# Patient Record
Sex: Female | Born: 1950 | Race: White | Hispanic: No | Smoking: Current every day smoker
Health system: Southern US, Community
[De-identification: ages and names within clinical notes are randomized; demographics above are authoritative.]

## PROBLEM LIST (undated history)

## (undated) DIAGNOSIS — M810 Age-related osteoporosis without current pathological fracture: Secondary | ICD-10-CM

## (undated) DIAGNOSIS — K7460 Unspecified cirrhosis of liver: Secondary | ICD-10-CM

## (undated) DIAGNOSIS — I739 Peripheral vascular disease, unspecified: Secondary | ICD-10-CM

## (undated) DIAGNOSIS — I70223 Atherosclerosis of native arteries of extremities with rest pain, bilateral legs: Secondary | ICD-10-CM

## (undated) HISTORY — PX: OTHER SURGICAL HISTORY: SHX169

---

## 2011-11-28 ENCOUNTER — Emergency Department (HOSPITAL_COMMUNITY)
Admission: EM | Admit: 2011-11-28 | Discharge: 2011-11-28 | Disposition: A | Discharge status: Home / Self Care | Payer: Self-pay | Attending: Emergency Medicine | Admitting: Emergency Medicine

## 2011-11-28 ENCOUNTER — Encounter (HOSPITAL_COMMUNITY): Payer: Self-pay | Admitting: Emergency Medicine

## 2011-11-28 DIAGNOSIS — R600 Localized edema: Secondary | ICD-10-CM

## 2011-11-28 DIAGNOSIS — R1900 Intra-abdominal and pelvic swelling, mass and lump, unspecified site: Secondary | ICD-10-CM | POA: Insufficient documentation

## 2011-11-28 DIAGNOSIS — R141 Gas pain: Secondary | ICD-10-CM | POA: Insufficient documentation

## 2011-11-28 DIAGNOSIS — R142 Eructation: Secondary | ICD-10-CM | POA: Insufficient documentation

## 2011-11-28 DIAGNOSIS — K7290 Hepatic failure, unspecified without coma: Secondary | ICD-10-CM

## 2011-11-28 DIAGNOSIS — R140 Abdominal distension (gaseous): Secondary | ICD-10-CM

## 2011-11-28 DIAGNOSIS — R17 Unspecified jaundice: Secondary | ICD-10-CM | POA: Insufficient documentation

## 2011-11-28 DIAGNOSIS — R609 Edema, unspecified: Secondary | ICD-10-CM | POA: Insufficient documentation

## 2011-11-28 DIAGNOSIS — F17200 Nicotine dependence, unspecified, uncomplicated: Secondary | ICD-10-CM | POA: Insufficient documentation

## 2011-11-28 DIAGNOSIS — R34 Anuria and oliguria: Secondary | ICD-10-CM | POA: Insufficient documentation

## 2011-11-28 DIAGNOSIS — M7989 Other specified soft tissue disorders: Secondary | ICD-10-CM | POA: Insufficient documentation

## 2011-11-28 DIAGNOSIS — K769 Liver disease, unspecified: Secondary | ICD-10-CM | POA: Insufficient documentation

## 2011-11-28 HISTORY — DX: Peripheral vascular disease, unspecified: I73.9

## 2011-11-28 LAB — DIFFERENTIAL
Basophils Absolute: 0.1 10*3/uL (ref 0.0–0.1)
Basophils Relative: 1 % (ref 0–1)
Monocytes Absolute: 0.5 10*3/uL (ref 0.1–1.0)
Neutro Abs: 8.7 10*3/uL — ABNORMAL HIGH (ref 1.7–7.7)
Neutrophils Relative %: 72 % (ref 43–77)

## 2011-11-28 LAB — CBC
MCHC: 36 g/dL (ref 30.0–36.0)
RDW: 14.4 % (ref 11.5–15.5)

## 2011-11-28 LAB — COMPREHENSIVE METABOLIC PANEL
ALT: 15 U/L (ref 0–35)
Calcium: 8 mg/dL — ABNORMAL LOW (ref 8.4–10.5)
GFR calc Af Amer: >90 mL/min (ref >90)
Glucose, Bld: 116 mg/dL — ABNORMAL HIGH (ref 70–99)
Sodium: 131 mEq/L — ABNORMAL LOW (ref 135–145)
Total Protein: 6.2 g/dL (ref 6.0–8.3)

## 2011-11-28 MED ORDER — FUROSEMIDE 20 MG PO TABS: 20.0000 mg | ORAL_TABLET | Freq: Two times a day (BID) | ORAL | Status: AC

## 2011-11-28 MED ORDER — POTASSIUM CHLORIDE CRYS ER 20 MEQ PO TBCR
40.0000 meq | EXTENDED_RELEASE_TABLET | Freq: Once | ORAL | Status: AC
  Administered 2011-11-28: 40 meq via ORAL
  Filled 2011-11-28: qty 2

## 2011-11-28 MED ORDER — SODIUM CHLORIDE 0.9 % IV SOLN: INTRAVENOUS | Status: DC

## 2011-11-28 NOTE — Discharge Instructions (Signed)
Your blood test show mild inflammation of your liver. Resume taking Lasix to reduce the swelling.  Followup with your Dr. for reevaluation.  Return for worse or uncontrolled symptoms

## 2011-11-28 NOTE — ED Notes (Signed)
Pt's family reports recent dx of liver disease d/t etoh.  Pt presents with ascites, and bila leg swelling.  Pt is jaundiced as well.  Pt assisted to the BR with family, pt refused to be taken to the BR with a wheelchair.  Pt ambulated to the BR with steady gait and one assist.  Went in to start IV but pt wanted to use BR first.

## 2011-11-28 NOTE — ED Notes (Signed)
Started 1  Month ago-- abd swelling , urine dark, jaundice, saw dr in April, was told had liver damage, since seeing dr. Roe Coombs has gotten bigger and more firm. Has pitting edema bilat ankles. Has gained 5 pounds in a couple weeks

## 2011-11-28 NOTE — ED Provider Notes (Signed)
History     CSN: [id number]  Arrival date & time 11/28/11  1616   First MD Initiated Contact with Patient 11/28/11 1807      Chief Complaint  Patient presents with  . abd swelling   . Jaundice  . oliguria     (Consider location/radiation/quality/duration/timing/severity/associated sxs/prior treatment) The history is provided by the patient and a relative.   the patient is a 61 year old, female, who drinks alcohol daily until the past month.  She presents to the emergency department with abdominal swelling.  Causing discomfort, which she does not describe his pain.  She also complains of lower extremity swelling.  She had been seen by her primary care physician at [facility] and prescribed a diuretic.  She is no longer taking the diuretic.  She denies pain.  She denies any other symptoms.  Past Medical History  Diagnosis Date  . PAD (peripheral artery disease)     stents 2010    Past Surgical History  Procedure Date  . Vulvular cancer     History reviewed. No pertinent family history.  History  Substance Use Topics  . Smoking status: Current Everyday Smoker -- 1.0 packs/day  . Smokeless tobacco: Not on file  . Alcohol Use: Yes     nothing for at least a month, but 2-6 beers a day    OB History    Grav Para Term Preterm Abortions TAB SAB Ect Mult Living                  Review of Systems  Constitutional: Negative for fever and chills.  HENT: Negative for neck pain.   Respiratory: Negative for cough and shortness of breath.   Cardiovascular: Positive for leg swelling. Negative for chest pain.  Gastrointestinal: Positive for abdominal distention. Negative for nausea, vomiting, abdominal pain and anal bleeding.  Skin: Negative for color change and rash.  Neurological: Negative for headaches.  Psychiatric/Behavioral: Negative for confusion.  All other systems reviewed and are negative.    Allergies  Review of patient's allergies indicates no known allergies.  Home  Medications   Current Outpatient Rx  Name Route Sig Dispense Refill  . ASPIRIN EC 81 MG PO TBEC Oral Take 81 mg by mouth daily.    Marland Kitchen VITAMIN D 1000 UNITS PO TABS Oral Take 1,000 Units by mouth daily.    . IBUPROFEN 400 MG PO TABS Oral Take 400 mg by mouth every 6 (six) hours as needed. For pain relief    . IBUPROFEN 200 MG PO TABS Oral Take 200 mg by mouth every 6 (six) hours as needed. For pain relief    . ADULT MULTIVITAMIN W/MINERALS CH Oral Take 1 tablet by mouth daily.    Marland Kitchen OMEPRAZOLE 20 MG PO CPDR Oral Take 20 mg by mouth daily.      BP 99/68  Pulse 98  Temp(Src) 97.5 F (36.4 C) (Oral)  Resp 22  Wt 118 lb (53.524 kg)  SpO2 100%  Physical Exam  Vitals reviewed. Constitutional: She is oriented to person, place, and time. No distress.       Sallow looking female in no distress  HENT:  Head: Normocephalic and atraumatic.  Eyes: Conjunctivae are normal. No scleral icterus.  Neck: Normal range of motion. Neck supple.  Cardiovascular: Normal rate and regular rhythm.   No murmur heard. Pulmonary/Chest: Effort normal. No respiratory distress. She has no wheezes. She has no rales.  Abdominal: Soft. She exhibits distension. There is tenderness. There is no rebound  and no guarding.       Mild right upper quadrant tenderness Liver edge approximately 4 cm below.  The right costal margin  Musculoskeletal: Normal range of motion. She exhibits edema.       2+ bilateral pitting edema in shins  Neurological: She is alert and oriented to person, place, and time. No cranial nerve deficit.       Negative asterixis  Skin: Skin is dry. No erythema.  Psychiatric: She has a normal mood and affect. Thought content normal.    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  No signs of hepatic encephalopathy or severe systemic illness   Labs Reviewed  CBC  DIFFERENTIAL  COMPREHENSIVE METABOLIC PANEL  URINALYSIS, ROUTINE W REFLEX MICROSCOPIC     No results found.   No diagnosis found.    MDM  Abdominal distention from chronic alcohol use        Cheri Guppy, MD 11/28/11 2236

## 2011-12-01 ENCOUNTER — Other Ambulatory Visit: Payer: Self-pay | Admitting: Gastroenterology

## 2011-12-01 DIAGNOSIS — K7460 Unspecified cirrhosis of liver: Secondary | ICD-10-CM

## 2011-12-02 ENCOUNTER — Ambulatory Visit
Admission: RE | Admit: 2011-12-02 | Discharge: 2011-12-02 | Disposition: A | Discharge status: Home / Self Care | Payer: Self-pay | Source: Ambulatory Visit | Attending: Gastroenterology | Admitting: Gastroenterology

## 2011-12-02 DIAGNOSIS — K7460 Unspecified cirrhosis of liver: Secondary | ICD-10-CM

## 2011-12-05 ENCOUNTER — Other Ambulatory Visit (HOSPITAL_COMMUNITY): Payer: Self-pay | Admitting: Gastroenterology

## 2011-12-05 DIAGNOSIS — R140 Abdominal distension (gaseous): Secondary | ICD-10-CM

## 2011-12-05 DIAGNOSIS — K7030 Alcoholic cirrhosis of liver without ascites: Secondary | ICD-10-CM

## 2011-12-08 ENCOUNTER — Ambulatory Visit (HOSPITAL_COMMUNITY)
Admission: RE | Admit: 2011-12-08 | Discharge: 2011-12-08 | Disposition: A | Discharge status: Home / Self Care | Payer: Self-pay | Source: Ambulatory Visit | Attending: Gastroenterology | Admitting: Gastroenterology

## 2011-12-08 DIAGNOSIS — R140 Abdominal distension (gaseous): Secondary | ICD-10-CM

## 2011-12-08 DIAGNOSIS — K7030 Alcoholic cirrhosis of liver without ascites: Secondary | ICD-10-CM

## 2011-12-08 DIAGNOSIS — R188 Other ascites: Secondary | ICD-10-CM | POA: Insufficient documentation

## 2011-12-08 DIAGNOSIS — K709 Alcoholic liver disease, unspecified: Secondary | ICD-10-CM | POA: Insufficient documentation

## 2011-12-08 LAB — COMPREHENSIVE METABOLIC PANEL
ALT: 15 U/L (ref 0–35)
AST: 88 U/L — ABNORMAL HIGH (ref 0–37)
Alkaline Phosphatase: 198 U/L — ABNORMAL HIGH (ref 39–117)
CO2: 27 mEq/L (ref 19–32)
Chloride: 89 mEq/L — ABNORMAL LOW (ref 96–112)
Creatinine, Ser: 0.53 mg/dL (ref 0.50–1.10)
GFR calc non Af Amer: >90 mL/min (ref >90)
Sodium: 129 mEq/L — ABNORMAL LOW (ref 135–145)
Total Bilirubin: 3.5 mg/dL — ABNORMAL HIGH (ref 0.3–1.2)

## 2011-12-08 LAB — ALBUMIN, FLUID (OTHER): Albumin, Fluid: 0.4 g/dL

## 2011-12-08 LAB — BODY FLUID CELL COUNT WITH DIFFERENTIAL

## 2011-12-08 NOTE — Procedures (Signed)
Successful US guided paracentesis from RLQ.  Yielded 2.5L of clear yellow fluid.  No immediate complications.  Pt tolerated well.   Specimen was sent for labs.  Brayton El PA-C 12/08/2011 12:23 PM

## 2011-12-09 LAB — PATHOLOGIST SMEAR REVIEW: Tech Review: REACTIVE

## 2011-12-12 LAB — BODY FLUID CULTURE: Culture: NO GROWTH

## 2011-12-27 ENCOUNTER — Encounter (HOSPITAL_COMMUNITY): Payer: Self-pay | Admitting: *Deleted

## 2011-12-27 ENCOUNTER — Emergency Department (HOSPITAL_COMMUNITY)
Admission: EM | Admit: 2011-12-27 | Discharge: 2011-12-27 | Disposition: A | Discharge status: Home / Self Care | Payer: Self-pay | Attending: Emergency Medicine | Admitting: Emergency Medicine

## 2011-12-27 DIAGNOSIS — K5900 Constipation, unspecified: Secondary | ICD-10-CM | POA: Insufficient documentation

## 2011-12-27 DIAGNOSIS — K644 Residual hemorrhoidal skin tags: Secondary | ICD-10-CM | POA: Insufficient documentation

## 2011-12-27 DIAGNOSIS — K649 Unspecified hemorrhoids: Secondary | ICD-10-CM

## 2011-12-27 HISTORY — DX: Age-related osteoporosis without current pathological fracture: M81.0

## 2011-12-27 HISTORY — DX: Unspecified cirrhosis of liver: K74.60

## 2011-12-27 LAB — COMPREHENSIVE METABOLIC PANEL
ALT: 19 U/L (ref 0–35)
AST: 75 U/L — ABNORMAL HIGH (ref 0–37)
Albumin: 2.6 g/dL — ABNORMAL LOW (ref 3.5–5.2)
Alkaline Phosphatase: 175 U/L — ABNORMAL HIGH (ref 39–117)
BUN: 10 mg/dL (ref 6–23)
CO2: 23 mEq/L (ref 19–32)
Calcium: 8.8 mg/dL (ref 8.4–10.5)
Chloride: 92 mEq/L — ABNORMAL LOW (ref 96–112)
Creatinine, Ser: 0.41 mg/dL — ABNORMAL LOW (ref 0.50–1.10)
GFR calc Af Amer: >90 mL/min (ref >90)
GFR calc non Af Amer: >90 mL/min (ref >90)
Glucose, Bld: 118 mg/dL — ABNORMAL HIGH (ref 70–99)
Potassium: 3.8 mEq/L (ref 3.5–5.1)
Sodium: 129 mEq/L — ABNORMAL LOW (ref 135–145)
Total Bilirubin: 2.3 mg/dL — ABNORMAL HIGH (ref 0.3–1.2)
Total Protein: 7.1 g/dL (ref 6.0–8.3)

## 2011-12-27 LAB — CBC
HCT: 34.5 % — ABNORMAL LOW (ref 36.0–46.0)
Hemoglobin: 12.4 g/dL (ref 12.0–15.0)
MCH: 40.7 pg — ABNORMAL HIGH (ref 26.0–34.0)
MCHC: 35.9 g/dL (ref 30.0–36.0)
MCV: 113.1 fL — ABNORMAL HIGH (ref 78.0–100.0)
Platelets: 269 10*3/uL (ref 150–400)
RBC: 3.05 MIL/uL — ABNORMAL LOW (ref 3.87–5.11)
RDW: 14.9 % (ref 11.5–15.5)
WBC: 14.1 10*3/uL — ABNORMAL HIGH (ref 4.0–10.5)

## 2011-12-27 LAB — DIFFERENTIAL
Basophils Absolute: 0.1 10*3/uL (ref 0.0–0.1)
Basophils Relative: 1 % (ref 0–1)
Eosinophils Absolute: 0 10*3/uL (ref 0.0–0.7)
Eosinophils Relative: 0 % (ref 0–5)
Lymphocytes Relative: 23 % (ref 12–46)
Lymphs Abs: 3.2 10*3/uL (ref 0.7–4.0)
Monocytes Absolute: 0.6 10*3/uL (ref 0.1–1.0)
Monocytes Relative: 4 % (ref 3–12)
Neutro Abs: 10.2 10*3/uL — ABNORMAL HIGH (ref 1.7–7.7)
Neutrophils Relative %: 72 % (ref 43–77)

## 2011-12-27 MED ORDER — HYDROCORTISONE 2.5 % RE CREA: TOPICAL_CREAM | RECTAL | Status: AC

## 2011-12-27 MED ORDER — PEG 3350-KCL-NABCB-NACL-NASULF 236 G PO SOLR: 4.0000 L | Freq: Once | ORAL | Status: AC

## 2011-12-27 MED ORDER — FLEET ENEMA 7-19 GM/118ML RE ENEM
1.0000 | ENEMA | Freq: Once | RECTAL | Status: AC
  Administered 2011-12-27: 1 via RECTAL
  Filled 2011-12-27: qty 1

## 2011-12-27 MED ORDER — LIDOCAINE HCL 2 % EX GEL: CUTANEOUS | Status: AC | PRN

## 2011-12-27 NOTE — ED Notes (Signed)
Pt states she is having rectum pain. Pt states she is able to pass gas but no bm.

## 2011-12-27 NOTE — ED Notes (Signed)
Per ems: pt is coming from home. Pt was seen at her pcp for impacted bowel. Pt was given laxative and told if she does not have a bm to come to the ed.

## 2011-12-27 NOTE — ED Notes (Signed)
[id number]> Expected date:<BR> Expected time: 2:47 PM<BR> Means of arrival:<BR> Comments:<BR> M61 - 61yoF Bowel impaction *TRIAGE-CAPABLE*

## 2011-12-27 NOTE — ED Notes (Signed)
Pt positioned in L Sims position..instructed to hold Fleets enema as long as possible for best results

## 2011-12-27 NOTE — Discharge Instructions (Signed)
You may try a another enema tomorrow. Drink as much of the go-lytely bottle as you can tolerate. Treat your hemorrhoids with lidocaine and anusol.

## 2011-12-27 NOTE — ED Provider Notes (Signed)
History     CSN: [id number]  Arrival date & time 12/27/11  1501   None     No chief complaint on file.   (Consider location/radiation/quality/duration/timing/severity/associated sxs/prior treatment) Patient is a 61 y.o. female presenting with constipation.  Constipation  The current episode started more than 2 weeks ago. The onset was gradual. The pain is severe. The stool is described as liquid. There was no prior successful therapy. Associated symptoms include abdominal pain and nausea. Pertinent negatives include no anorexia, no fever, no vomiting and no chest pain.  61 y/o female INAD with PMH significant for liver insufficiency c/o constipation x1 month with overflow diarrhea. Pt saw performed a mineral oil enema yesterday with little relief and was instructed to follow at the ED by her PCP. Pt reports LLQ abdominal pain at 10/10 no radiating. reduced appetite and nausea. Denies fever/Chills. Pt is passing gas and leaking liquid stool actively. Last colonoscopy was in 2006 and, as per patient shows no abnormality. Denies h/o abdominal surgeries or FH of colorectal cancer.   Past Medical History  Diagnosis Date  . PAD (peripheral artery disease)     stents 2010  . Osteoporosis   . Cirrhosis of liver     Past Surgical History  Procedure Date  . Vulvular cancer     No family history on file.  History  Substance Use Topics  . Smoking status: Current Everyday Smoker -- 1.0 packs/day  . Smokeless tobacco: Not on file  . Alcohol Use: Yes     nothing for at least a month, but 2-6 beers a day    OB History    Grav Para Term Preterm Abortions TAB SAB Ect Mult Living                  Review of Systems  Constitutional: Negative.  Negative for fever.  Respiratory: Negative for shortness of breath.   Cardiovascular: Negative for chest pain.  Gastrointestinal: Positive for nausea, abdominal pain and constipation. Negative for vomiting, abdominal distention and anorexia.   Genitourinary: Negative for difficulty urinating.    Allergies  Review of patient's allergies indicates no known allergies.  Home Medications   Current Outpatient Rx  Name Route Sig Dispense Refill  . ASPIRIN EC 81 MG PO TBEC Oral Take 81 mg by mouth daily.    Marland Kitchen VITAMIN D 1000 UNITS PO TABS Oral Take 1,000 Units by mouth daily.    . FUROSEMIDE 20 MG PO TABS Oral Take 1 tablet (20 mg total) by mouth 2 (two) times daily. 30 tablet 0  . ADULT MULTIVITAMIN W/MINERALS CH Oral Take 1 tablet by mouth daily.    Marland Kitchen OMEPRAZOLE 20 MG PO CPDR Oral Take 20 mg by mouth daily.      BP 102/59  Pulse 115  Temp(Src) 97.8 F (36.6 C) (Oral)  Resp 20  SpO2 100%  Physical Exam  Constitutional: She is oriented to person, place, and time. She appears well-developed. No distress.       Extremely thin  HENT:  Head: Normocephalic and atraumatic.  Eyes: Pupils are equal, round, and reactive to light.  Neck: Normal range of motion.  Cardiovascular: Normal rate.   Pulmonary/Chest: Effort normal and breath sounds normal.  Abdominal: Soft. Bowel sounds are normal. She exhibits no distension.       Mild LLQ abdominal pain, external hemorrhoids and hard stool in vault  Musculoskeletal: Normal range of motion.  Neurological: She is alert and oriented to person, place, and time.  Skin: Skin is warm and dry. She is not diaphoretic.  Psychiatric: She has a normal mood and affect.    ED Course  Procedures (including critical care time)  Labs Reviewed  CBC - Abnormal; Notable for the following:    WBC 14.1 (*)    RBC 3.05 (*)    HCT 34.5 (*)    MCV 113.1 (*)    MCH 40.7 (*)    All other components within normal limits  DIFFERENTIAL - Abnormal; Notable for the following:    Neutro Abs 10.2 (*)    All other components within normal limits  COMPREHENSIVE METABOLIC PANEL - Abnormal; Notable for the following:    Sodium 129 (*)    Chloride 92 (*)    Glucose, Bld 118 (*)    Creatinine, Ser 0.41 (*)     Albumin 2.6 (*)    AST 75 (*) SLIGHT HEMOLYSIS   Alkaline Phosphatase 175 (*)    Total Bilirubin 2.3 (*)    All other components within normal limits  URINALYSIS, ROUTINE W REFLEX MICROSCOPIC   No results found.   No diagnosis found. Fecal impaction   Pt would not tolerate manual disimpaction secondary to pain  Pt given fleet enema with no no bowel movement   MDM  Pt with fecal impaction and VSS, will d./c with treatment for hemorrhoids and go lightly and instruct to follow with PCP        Wynetta Emery, PA-C 12/27/11 2028

## 2011-12-27 NOTE — ED Notes (Signed)
Pt waiting for lab results 

## 2011-12-30 NOTE — ED Provider Notes (Signed)
Medical screening examination/treatment/procedure(s) were performed by non-physician practitioner and as supervising physician I was immediately available for consultation/collaboration.  Conna Terada, MD 12/30/11 0133 

## 2012-03-14 ENCOUNTER — Ambulatory Visit (HOSPITAL_COMMUNITY): Payer: Self-pay | Admitting: Anesthesiology

## 2012-03-14 ENCOUNTER — Ambulatory Visit (HOSPITAL_COMMUNITY)
Admission: RE | Admit: 2012-03-14 | Discharge: 2012-03-14 | Disposition: A | Discharge status: Home / Self Care | Payer: Self-pay | Source: Ambulatory Visit | Attending: Gastroenterology | Admitting: Gastroenterology

## 2012-03-14 ENCOUNTER — Encounter (HOSPITAL_COMMUNITY): Payer: Self-pay | Admitting: Anesthesiology

## 2012-03-14 ENCOUNTER — Encounter (HOSPITAL_COMMUNITY)
Admission: RE | Disposition: A | Discharge status: Home / Self Care | Payer: Self-pay | Source: Ambulatory Visit | Attending: Gastroenterology

## 2012-03-14 DIAGNOSIS — K319 Disease of stomach and duodenum, unspecified: Secondary | ICD-10-CM | POA: Insufficient documentation

## 2012-03-14 DIAGNOSIS — K7030 Alcoholic cirrhosis of liver without ascites: Secondary | ICD-10-CM | POA: Insufficient documentation

## 2012-03-14 DIAGNOSIS — F1020 Alcohol dependence, uncomplicated: Secondary | ICD-10-CM | POA: Insufficient documentation

## 2012-03-14 SURGERY — ESOPHAGOGASTRODUODENOSCOPY (EGD) WITH PROPOFOL
Anesthesia: Monitor Anesthesia Care

## 2012-03-14 MED ORDER — LACTATED RINGERS IV SOLN
INTRAVENOUS | Status: DC
  Administered 2012-03-14: 11:00:00 via INTRAVENOUS

## 2012-03-14 MED ORDER — LACTATED RINGERS IV SOLN
INTRAVENOUS | Status: DC | PRN
  Administered 2012-03-14: 10:00:00 via INTRAVENOUS

## 2012-03-14 MED ORDER — MIDAZOLAM HCL 5 MG/5ML IJ SOLN
INTRAMUSCULAR | Status: DC | PRN
  Administered 2012-03-14: 2 mg via INTRAVENOUS

## 2012-03-14 MED ORDER — FENTANYL CITRATE 0.05 MG/ML IJ SOLN
INTRAMUSCULAR | Status: DC | PRN
  Administered 2012-03-14 (×2): 50 ug via INTRAVENOUS

## 2012-03-14 MED ORDER — LIDOCAINE HCL (CARDIAC) 20 MG/ML IV SOLN
INTRAVENOUS | Status: DC | PRN
  Administered 2012-03-14: 70 mg via INTRAVENOUS

## 2012-03-14 MED ORDER — SODIUM CHLORIDE 0.9 % IV SOLN: INTRAVENOUS | Status: DC

## 2012-03-14 MED ORDER — PROPOFOL INFUSION 10 MG/ML OPTIME
INTRAVENOUS | Status: DC | PRN
  Administered 2012-03-14: 75 ug/kg/min via INTRAVENOUS

## 2012-03-14 SURGICAL SUPPLY — 14 items

## 2012-03-14 NOTE — Anesthesia Preprocedure Evaluation (Addendum)
Anesthesia Evaluation  Patient identified by MRN, date of birth, ID band Patient awake    Reviewed: Allergy & Precautions, H&P , NPO status , Patient's Chart, lab work & pertinent test results  Airway Mallampati: II TM Distance: >3 FB Neck ROM: full    Dental  (+) Dental Advisory Given, Edentulous Upper and Edentulous Lower   Pulmonary neg pulmonary ROS, COPDCurrent Smoker,  breath sounds clear to auscultation  Pulmonary exam normal       Cardiovascular Exercise Tolerance: Good Rhythm:regular Rate:Normal  Peripheral vascular disease with stents 2010   Neuro/Psych negative neurological ROS  negative psych ROS   GI/Hepatic negative GI ROS, (+) Cirrhosis -  ascites     ,   Endo/Other  negative endocrine ROS  Renal/GU negative Renal ROS  negative genitourinary   Musculoskeletal   Abdominal   Peds  Hematology negative hematology ROS (+)   Anesthesia Other Findings   Reproductive/Obstetrics negative OB ROS Vulvar cancer                          Anesthesia Physical Anesthesia Plan  ASA: III  Anesthesia Plan: MAC   Post-op Pain Management:    Induction:   Airway Management Planned:   Additional Equipment:   Intra-op Plan:   Post-operative Plan:   Informed Consent: I have reviewed the patients History and Physical, chart, labs and discussed the procedure including the risks, benefits and alternatives for the proposed anesthesia with the patient or authorized representative who has indicated his/her understanding and acceptance.   Dental Advisory Given  Plan Discussed with: CRNA and Surgeon  Anesthesia Plan Comments:         Anesthesia Quick Evaluation

## 2012-03-14 NOTE — Transfer of Care (Signed)
Immediate Anesthesia Transfer of Care Note  Patient: Wendy Dixon  Procedure(s) Performed: Procedure(s) (LRB): ESOPHAGOGASTRODUODENOSCOPY (EGD) WITH PROPOFOL (N/A)  Patient Location: PACU  Anesthesia Type: MAC  Level of Consciousness: sedated, patient cooperative and responds to stimulaton  Airway & Oxygen Therapy: Patient Spontanous Breathing and Patient connected to face mask oxgen  Post-op Assessment: Report given to PACU RN and Post -op Vital signs reviewed and stable  Post vital signs: Reviewed and stable  Complications: No apparent anesthesia complications

## 2012-03-14 NOTE — Op Note (Signed)
[facility] [address] [place], [address]   ENDOSCOPY PROCEDURE REPORT  PATIENT: Wendy, Dixon  MR#: [id number] BIRTHDATE: 1951/01/11 , 61  yrs. old GENDER: Female ENDOSCOPIST: Willis Modena, MD REFERRED BY:  Ancil Boozer, MD PROCEDURE DATE:  03/14/2012 PROCEDURE:  Upper Endoscopy ASA CLASS:     ASA III INDICATIONS:  cirrhosis, variceal screening MEDICATIONS: MAC Anesthesia TOPICAL ANESTHETIC: Cetacaine x 2  DESCRIPTION OF PROCEDURE: After the risks benefits and alternatives of the procedure were thoroughly explained, informed consent was obtained.  The Pentax [id number] peds [id number] endoscope was introduced through the mouth and advanced to the second portion of the duodenum     . Without limitations.  The instrument was slowly withdrawn as the mucosa was fully examined.     Findings: Normal esophagus; specifically, no evidence of esophageal varices.  Mild portal gastropathy, otherwise normal stomach; no evidence of gastric varices upon retroflexion into the cardia. Otherwise normal endoscopy to the second portion of the duodenum.  ENDOSCOPIC IMPRESSION:     As above.  No evidence of esophageal or gastric varices.  RECOMMENDATIONS:     1.  Watch for potential complications of procedure. 2.  No need for beta blockade therapy. 3.  Consider repeat EGD 2-3 years. 4.  Follow-up with [facility] in 6-8 weeks.  eSigned:  Willis Modena, MD 03/14/2012 11:20 AM   CC:

## 2012-03-14 NOTE — H&P (Signed)
Patient interval history reviewed.  Patient examined again.  There has been no change from documented H/P dated 02/24/12 (scanned into chart from our office) except as documented above.  Assessment:  1.  Cirrhosis, alcohol-mediated. 2.  Screening for varices (no clinical bleeding).  Plan:  1.  Upper endoscopy. 2.  Risks (bleeding, infection, bowel perforation that could require surgery, sedation-related changes in cardiopulmonary systems), benefits (identification and possible treatment of source of symptoms, exclusion of certain causes of symptoms), and alternatives (watchful waiting, radiographic imaging studies, empiric medical treatment) of upper endoscopy (EGD) were explained to patient/son in detail and patient wishes to proceed.

## 2012-03-14 NOTE — Anesthesia Postprocedure Evaluation (Signed)
  Anesthesia Post-op Note  Patient: Wendy Dixon  Procedure(s) Performed: Procedure(s) (LRB): ESOPHAGOGASTRODUODENOSCOPY (EGD) WITH PROPOFOL (N/A)  Patient Location: PACU  Anesthesia Type: MAC  Level of Consciousness: awake and alert   Airway and Oxygen Therapy: Patient Spontanous Breathing  Post-op Pain: mild  Post-op Assessment: Post-op Vital signs reviewed, Patient's Cardiovascular Status Stable, Respiratory Function Stable, Patent Airway and No signs of Nausea or vomiting  Post-op Vital Signs: stable  Complications: No apparent anesthesia complications

## 2012-03-14 NOTE — Preoperative (Signed)
Beta Blockers   Reason not to administer Beta Blockers:Not Applicable 

## 2012-05-25 ENCOUNTER — Other Ambulatory Visit: Payer: Self-pay | Admitting: Gastroenterology

## 2012-05-25 DIAGNOSIS — K7460 Unspecified cirrhosis of liver: Secondary | ICD-10-CM

## 2012-05-30 ENCOUNTER — Ambulatory Visit
Admission: RE | Admit: 2012-05-30 | Discharge: 2012-05-30 | Disposition: A | Discharge status: Home / Self Care | Payer: No Typology Code available for payment source | Source: Ambulatory Visit | Attending: Gastroenterology | Admitting: Gastroenterology

## 2012-05-30 DIAGNOSIS — K7460 Unspecified cirrhosis of liver: Secondary | ICD-10-CM

## 2012-10-09 IMAGING — US US ABDOMEN COMPLETE
1 series · 13 of 25 positions shown · non-contrast
Comparison: None.

***ADDENDUM*** CREATED: 12/05/2011 [DATE]

The original report was by Dr. Dr. Marlena Krull.  The following
addendum is by Dr. Khadra Gervacio:
 Dr. [REDACTED] called to find out whether there is an
adequate amount of ascites for paracentesis at [DATE] p.m. on
12/05/2011.  At the time of exam there does appear to be an
adequate amount of ascites for successful paracentesis.
CLINICAL DATA: Cirrhosis.  Ascites.
COMPLETE ABDOMINAL ULTRASOUND

[Series 1: us abdomen complete · 0.26mm/px · 13 of 84 slices shown]
[im 1/84]
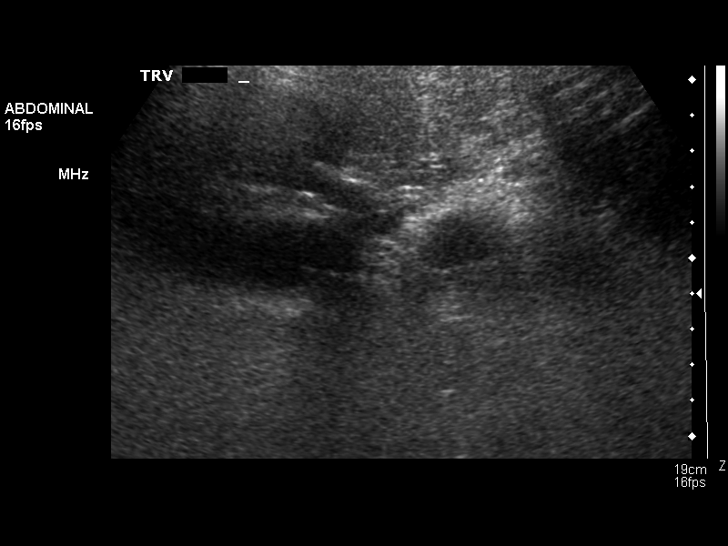
[im 7/84]
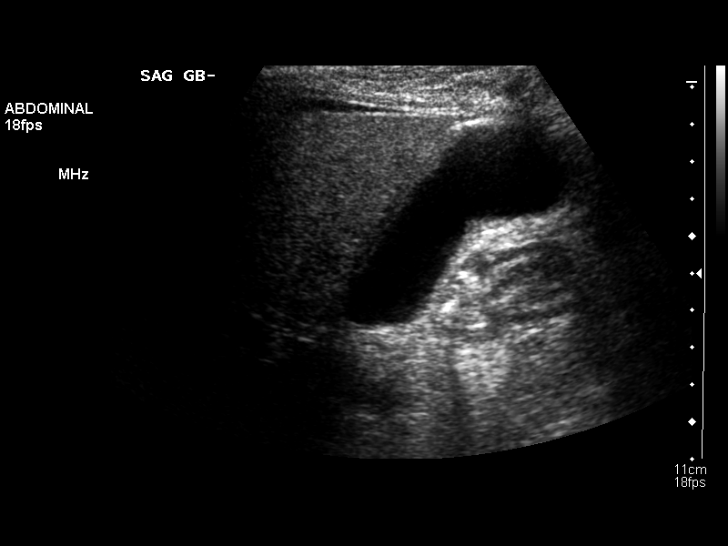
[im 14/84]
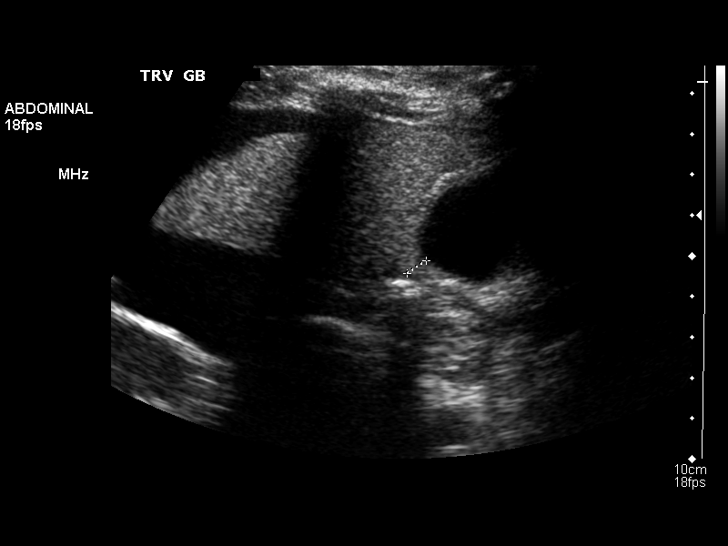
[im 21/84]
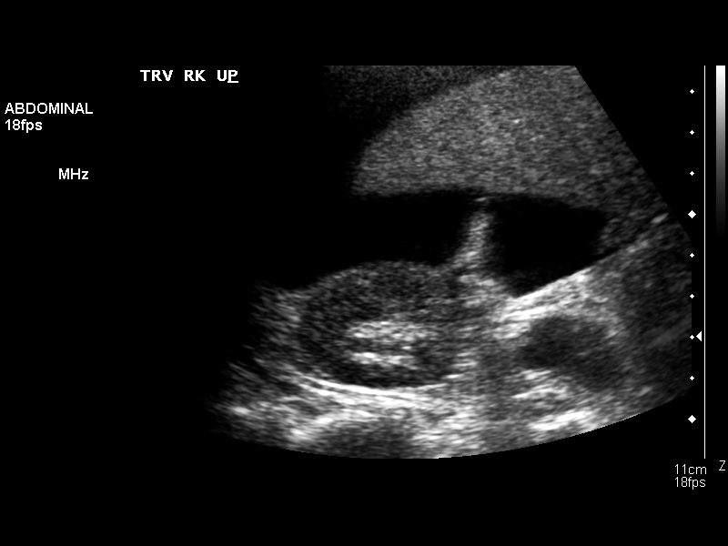
[im 28/84]
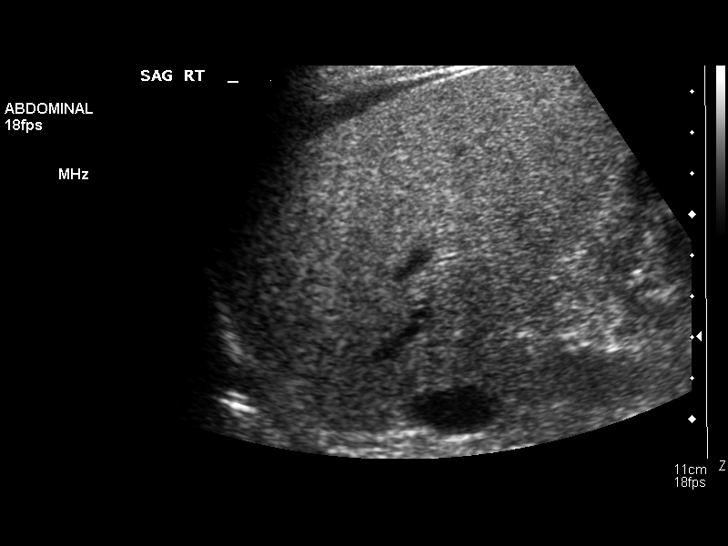
[im 35/84]
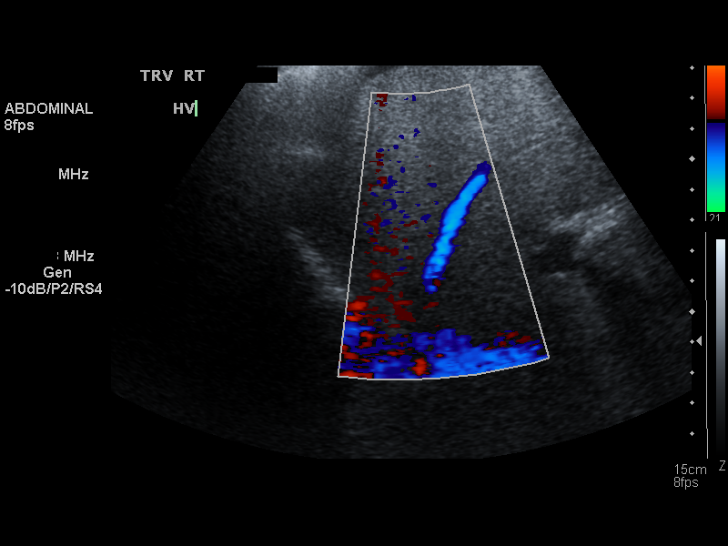
[im 42/84]
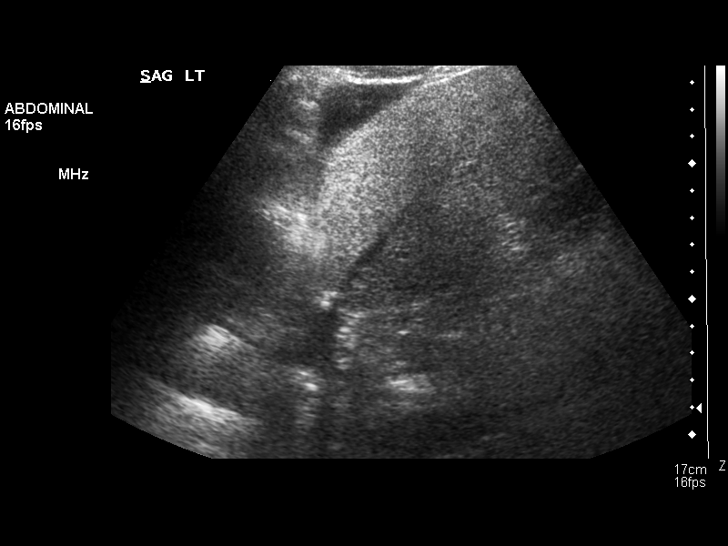
[im 49/84]
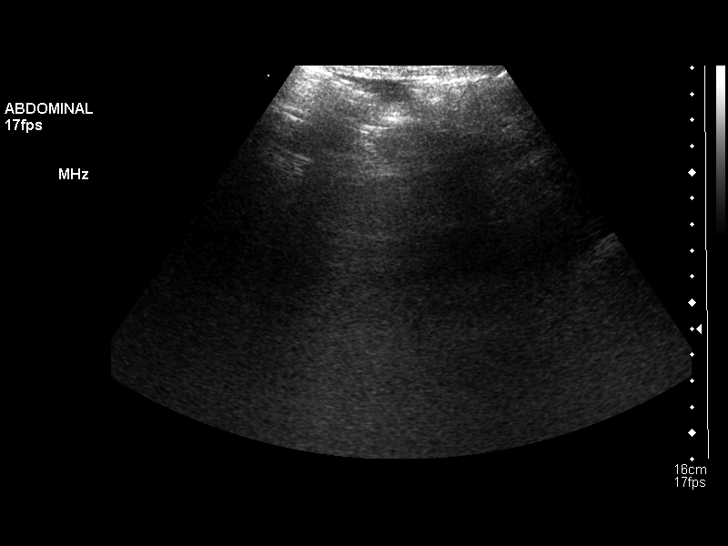
[im 56/84]
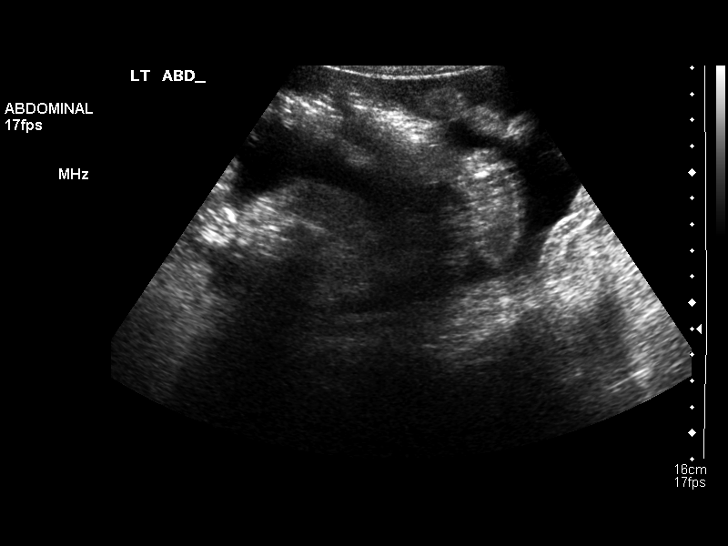
[im 63/84]
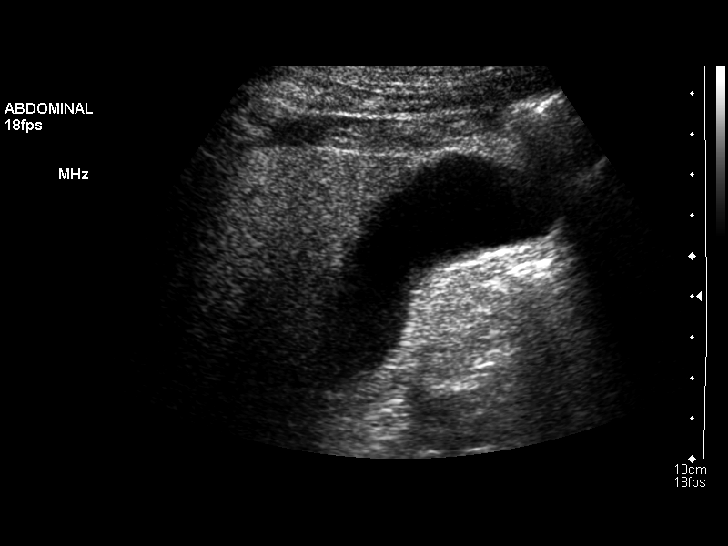
[im 70/84]
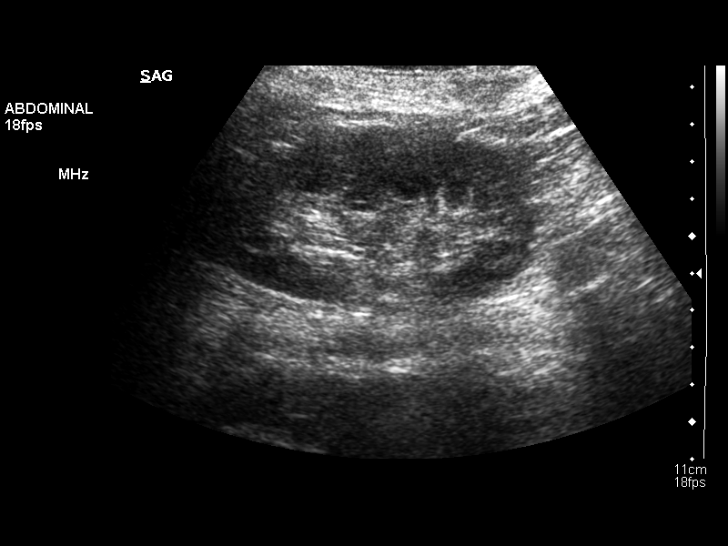
[im 77/84]
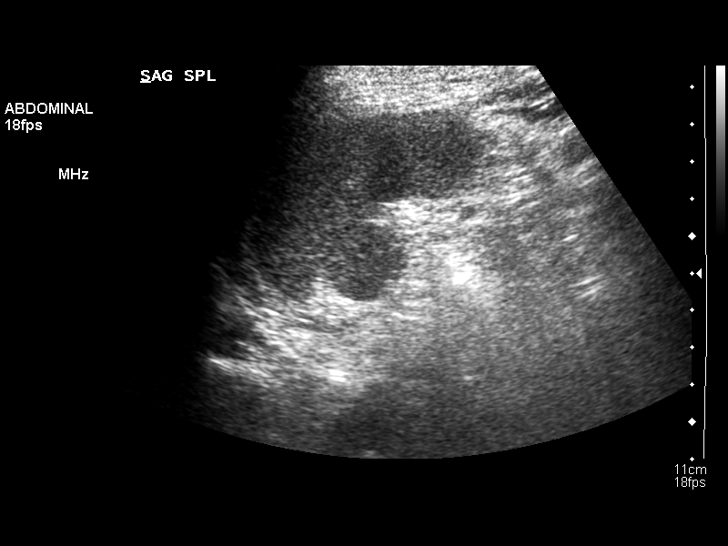
[im 84/84]
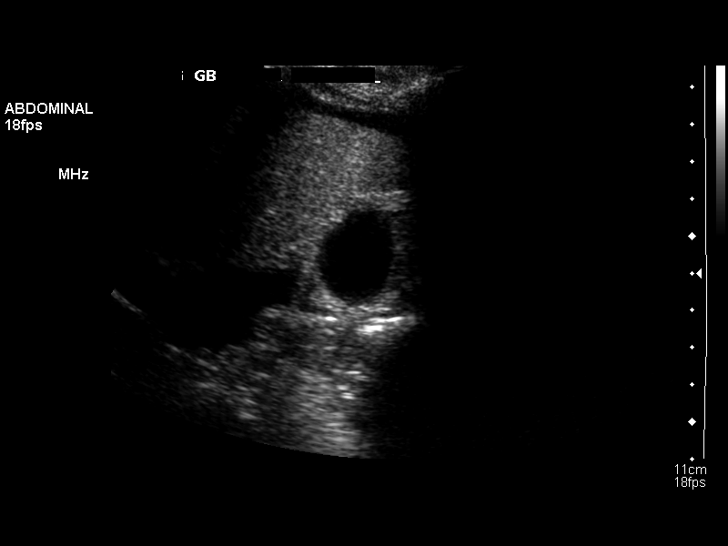

[13 of 25 positions shown; findings below may reference images not displayed]

FINDINGS: Gallbladder:  No stones, sludge, pericholecystic fluid.  Small
amount of ascites is present.  Gallbladder wall thickening is
present which is nonspecific in the setting of cirrhosis and
ascites have.

Common bile duct:  2 mm, normal.

Liver:  The liver is echogenic, with heterogeneous echotexture.
There is no micronodular cirrhosis along the surface of the liver
identified.  No focal mass lesion is identified. Normal blood flow
in the portal vein.

IVC:  Nonvisualization due to overlying bowel gas.

Pancreas:  Suboptimal visualization due to overlying bowel gas.

Spleen:  Splenic echotexture is within normal limits.  7.6 cm long
axis.

Right Kidney:  10.6 cm. Normal echotexture.  Normal central sinus
echo complex.  No calculi or hydronephrosis.

Left Kidney:  10.0 cm. Normal echotexture.  Normal central sinus
echo complex.  No calculi or hydronephrosis.

Abdominal aorta:  Nonvisualization due to overlying bowel gas.
IMPRESSION: 1.  Ascites and heterogeneous echogenic liver, likely representing
hepatic cirrhosis.  No micronodular cirrhosis is identified along
the surface of the liver.  No hepatic masses identified.
2.  Nonspecific gallbladder wall thickening, likely associated with
cirrhosis.
3.  Normal size of the spleen.

## 2012-12-26 ENCOUNTER — Other Ambulatory Visit: Payer: Self-pay | Admitting: Gastroenterology

## 2012-12-26 DIAGNOSIS — K7460 Unspecified cirrhosis of liver: Secondary | ICD-10-CM

## 2012-12-31 ENCOUNTER — Ambulatory Visit
Admission: RE | Admit: 2012-12-31 | Discharge: 2012-12-31 | Disposition: A | Discharge status: Home / Self Care | Payer: No Typology Code available for payment source | Source: Ambulatory Visit | Attending: Gastroenterology | Admitting: Gastroenterology

## 2012-12-31 DIAGNOSIS — K7460 Unspecified cirrhosis of liver: Secondary | ICD-10-CM

## 2013-04-07 IMAGING — US US ABDOMEN LIMITED
1 series · 14 of 25 positions shown · non-contrast
Comparison: Abdominal ultrasound - 12/02/2011; ultrasound guided
paracentesis - 12/08/2011

CLINICAL DATA: Cirrhosis, history smoking

LIMITED ABDOMINAL ULTRASOUND - RIGHT UPPER QUADRANT

[Series 1: us abdomen limited · 0.28mm/px · 14 of 48 slices shown]
[im 1/48]
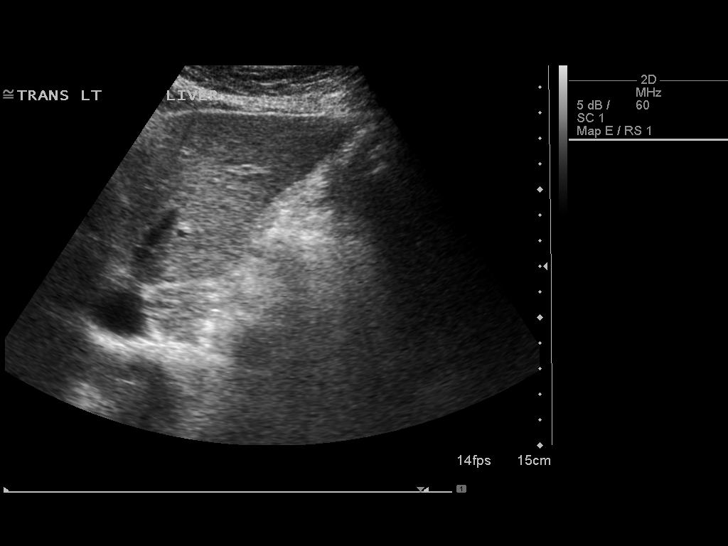
[im 4/48]
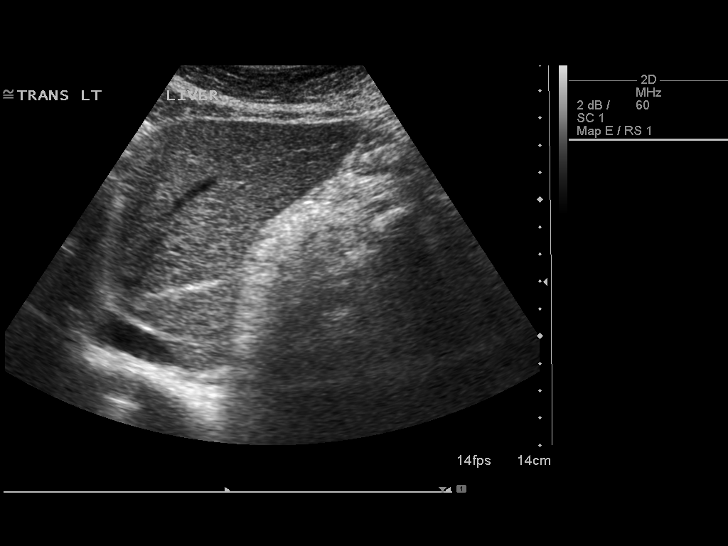
[im 8/48]
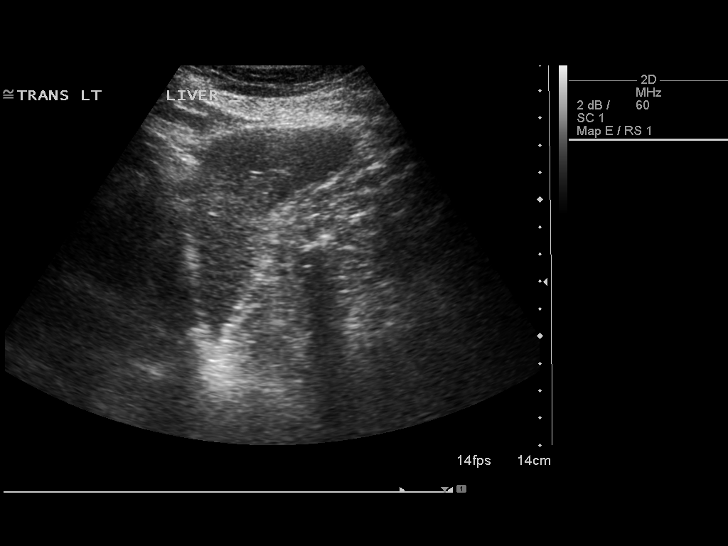
[im 12/48]
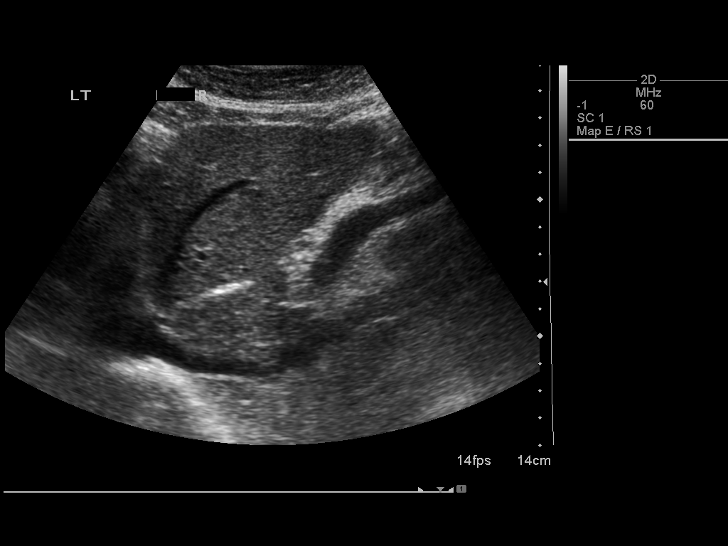
[im 16/48]
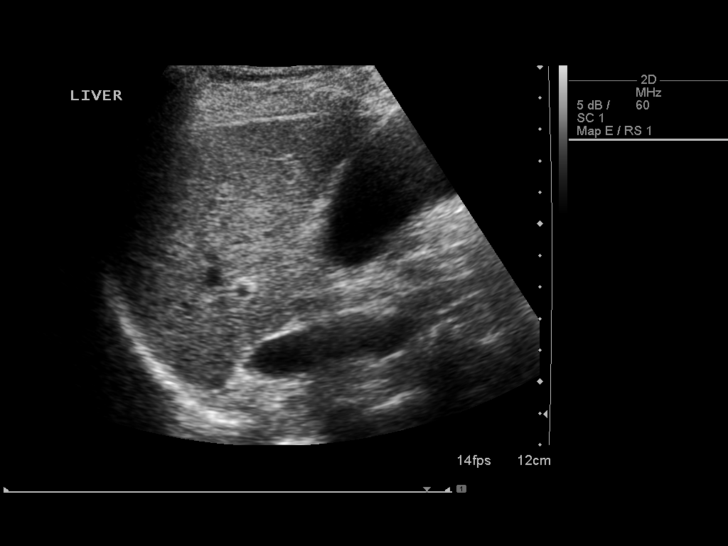
[im 18/48]
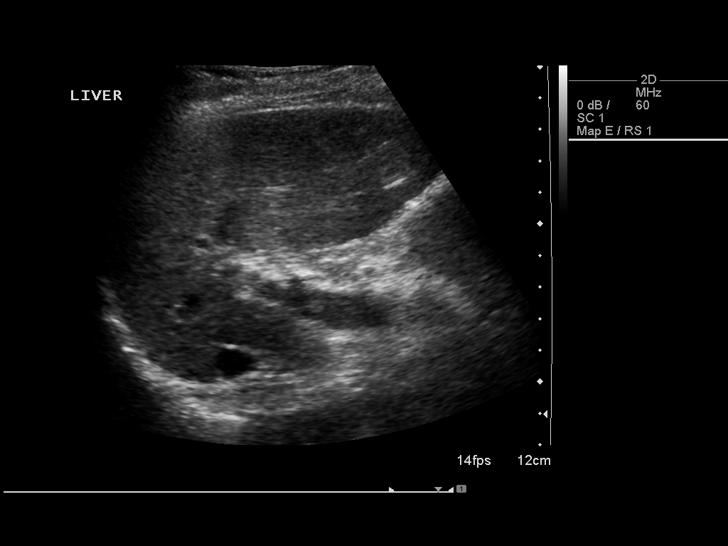
[im 22/48]
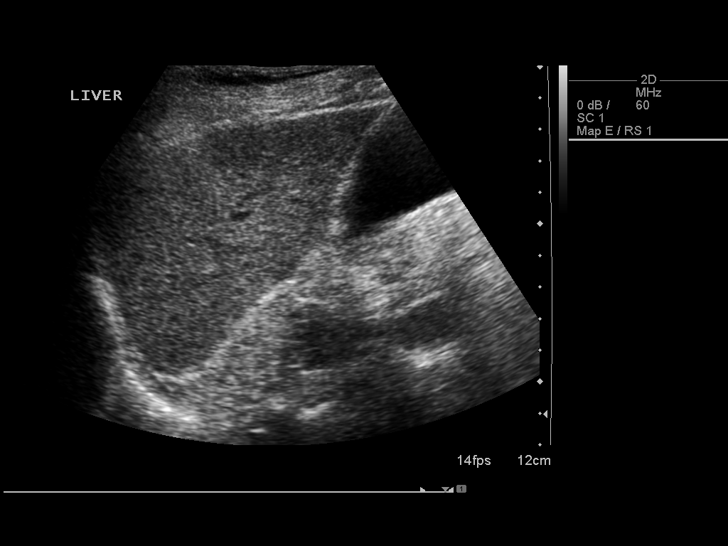
[im 26/48]
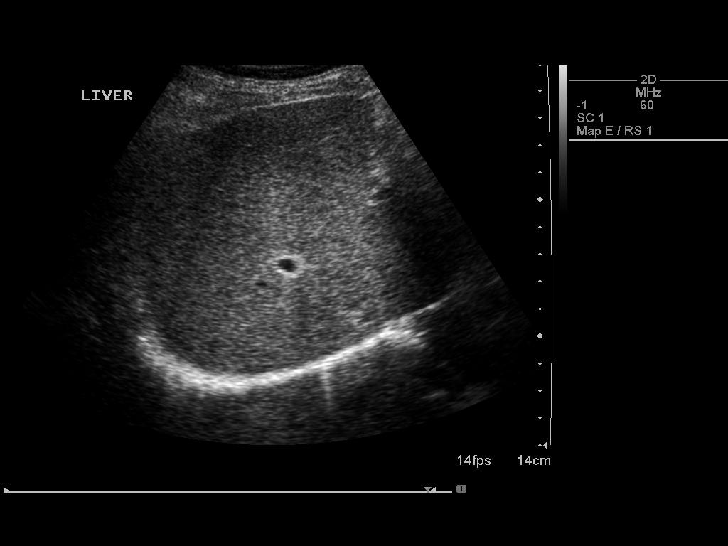
[im 30/48]
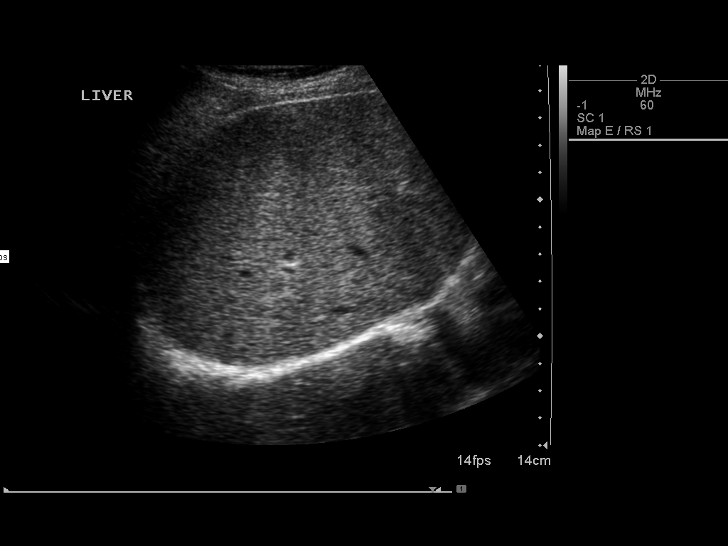
[im 32/48]
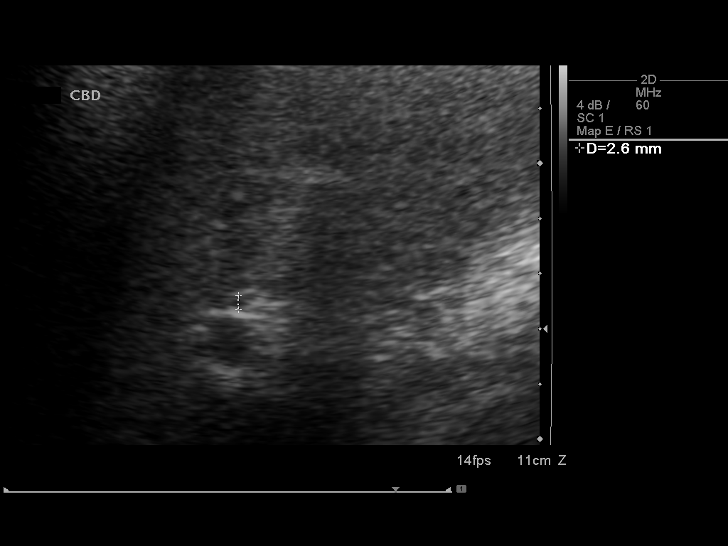
[im 36/48]
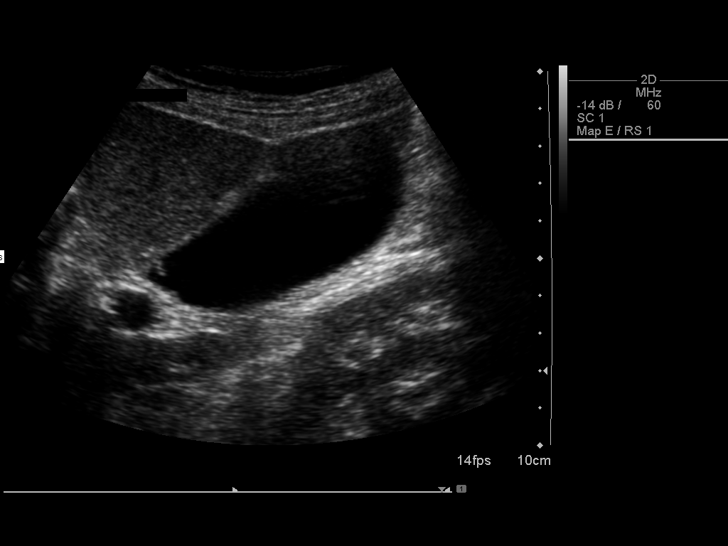
[im 40/48]
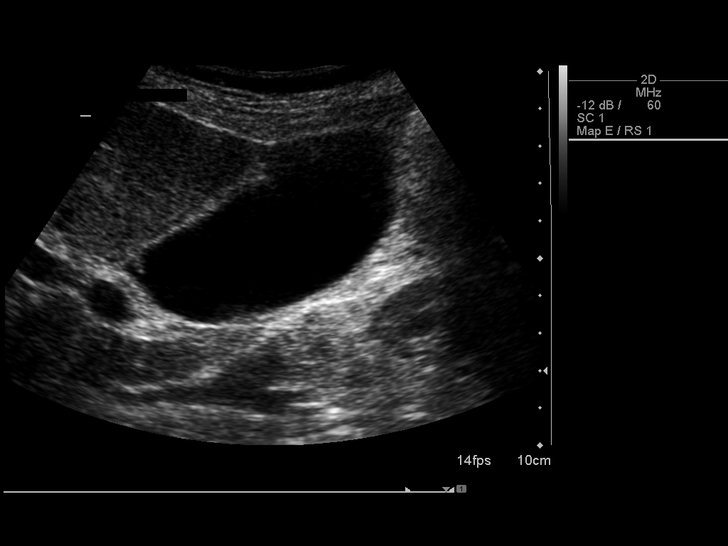
[im 44/48]
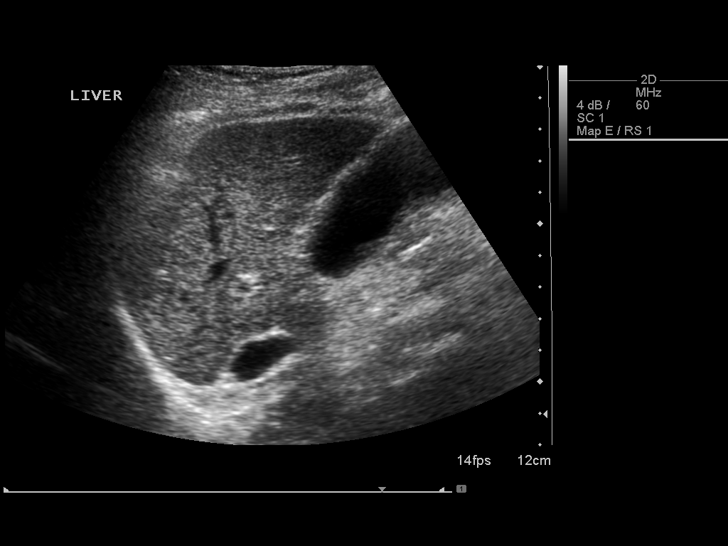
[im 48/48]
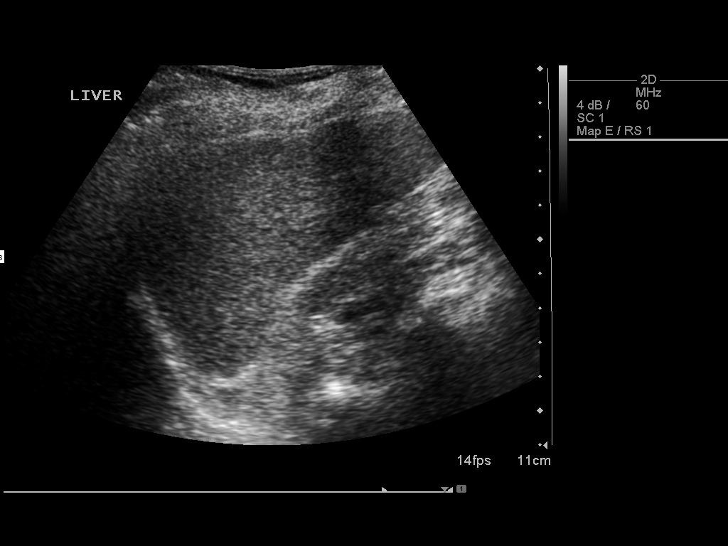

[14 of 25 positions shown; findings below may reference images not displayed]

FINDINGS: Gallbladder:  Sonographically normal.  No echogenic gallstones or
gall sludge.  No gallbladder wall thickening or pericholecystic
fluid.  Negative sonographic Murphy's sign.

Common bile duct:  Normal in size, measuring 2.6 mm in diameter.

Liver:  There is mild diffuse increased coarsened echogenicity of
the hepatic parenchyma.  No discrete hepatic lesions.  No definite
intra or extrahepatic biliary ductal dilatation.  No ascites.
IMPRESSION: 1.  Mild diffuse increased coarsened echogenicity hepatic
parenchyma suggestive of reported history of cirrhosis.  No
discrete hepatic lesions.  Further evaluation with abdominal CT or
MRI may be performed as clinically indicated.
2.  No ascites.

3.  No evidence of cholelithiasis.

## 2013-09-24 ENCOUNTER — Other Ambulatory Visit: Payer: Self-pay | Admitting: Gastroenterology

## 2013-09-24 DIAGNOSIS — K7460 Unspecified cirrhosis of liver: Secondary | ICD-10-CM

## 2013-10-09 ENCOUNTER — Other Ambulatory Visit: Payer: Self-pay

## 2013-10-10 ENCOUNTER — Other Ambulatory Visit: Payer: Self-pay

## 2013-11-08 IMAGING — US US ABDOMEN COMPLETE
1 series · 14 of 25 positions shown · non-contrast
Comparison: 05/30/2012

CLINICAL DATA: Cirrhosis

ABDOMINAL ULTRASOUND COMPLETE

[Series 1: us abdomen complete · 0.20mm/px · 14 of 67 slices shown]
[im 1/67]
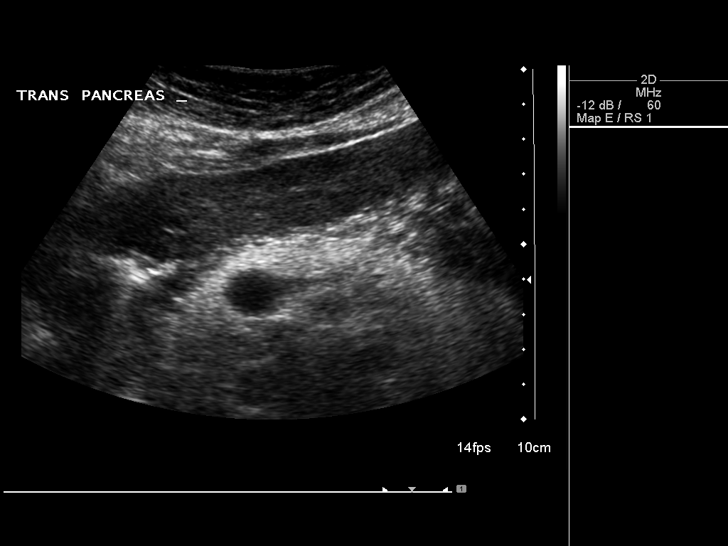
[im 6/67]
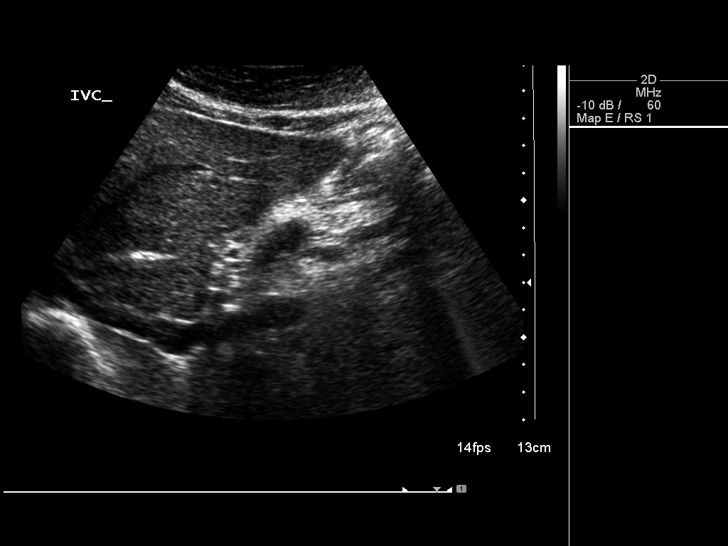
[im 12/67]
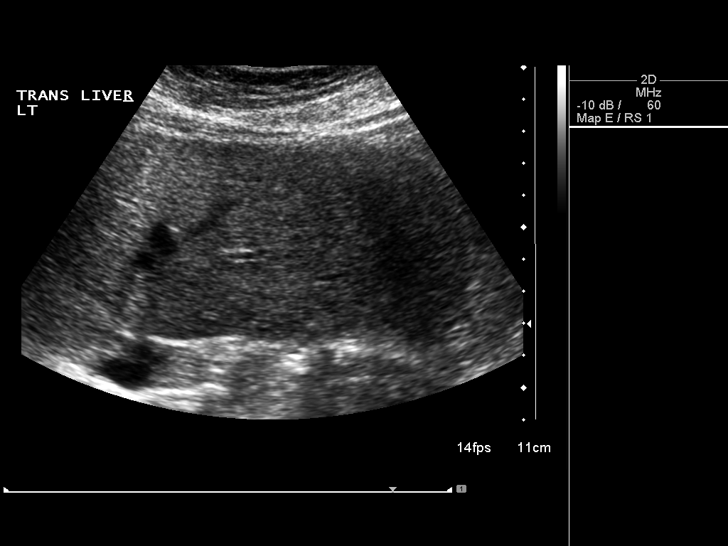
[im 17/67]
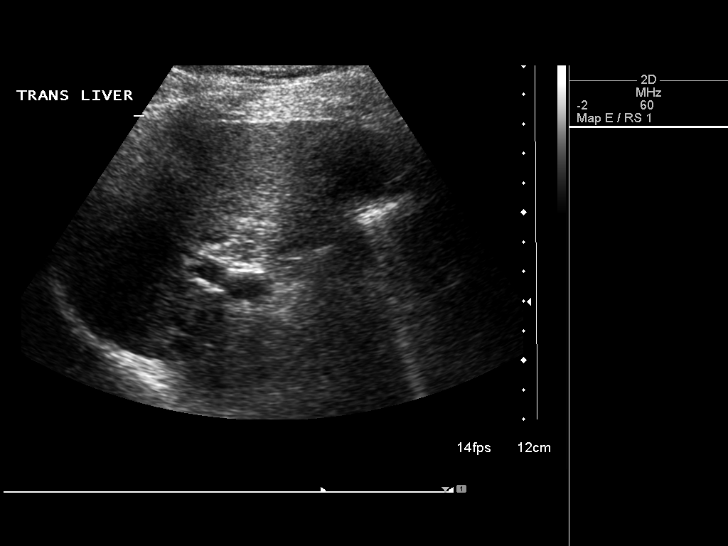
[im 23/67]
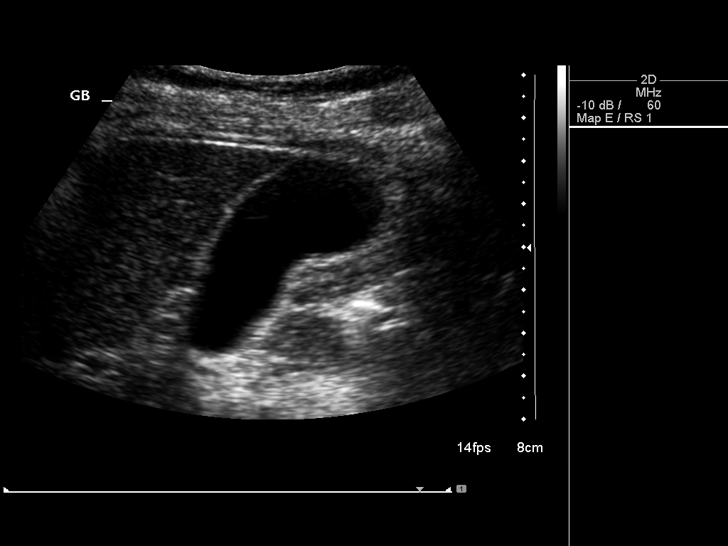
[im 25/67]
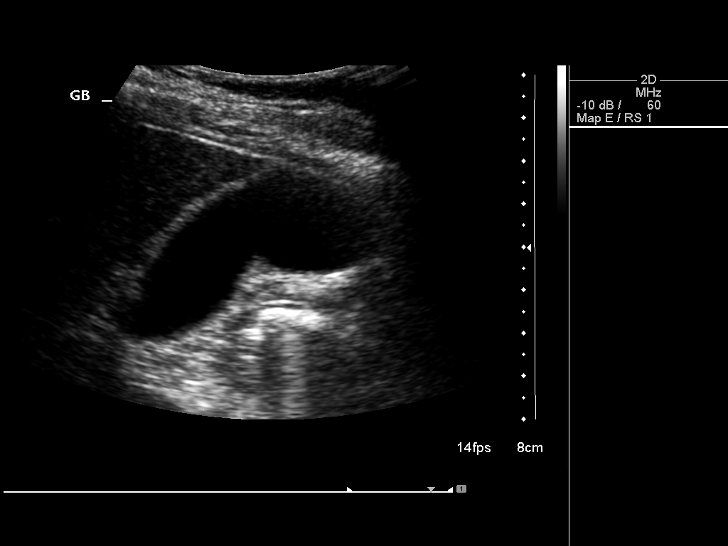
[im 31/67]
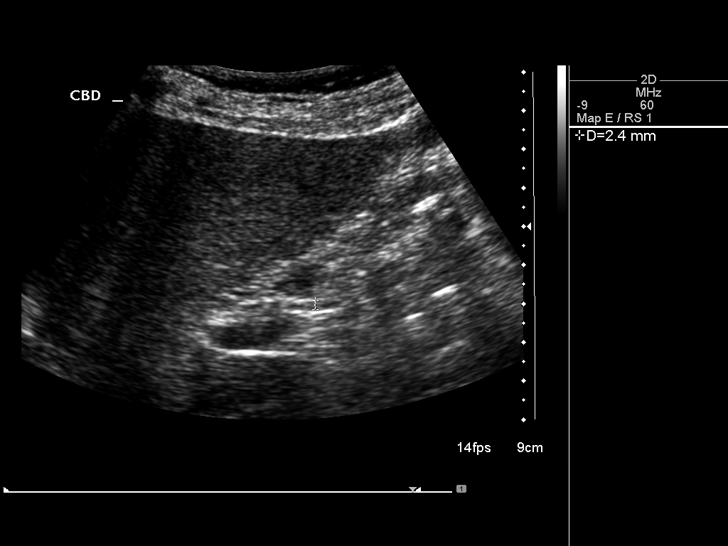
[im 36/67]
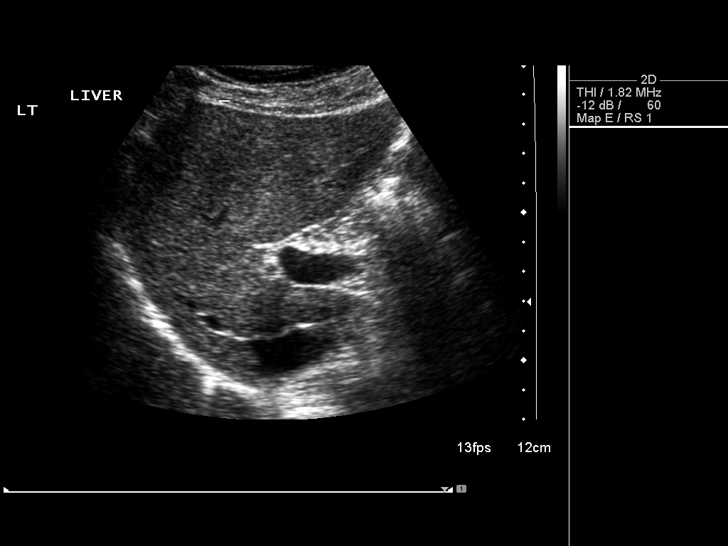
[im 42/67]
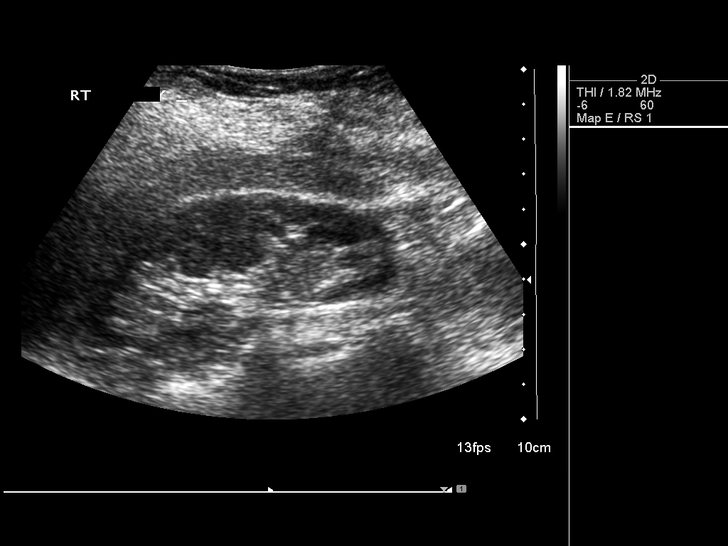
[im 45/67]
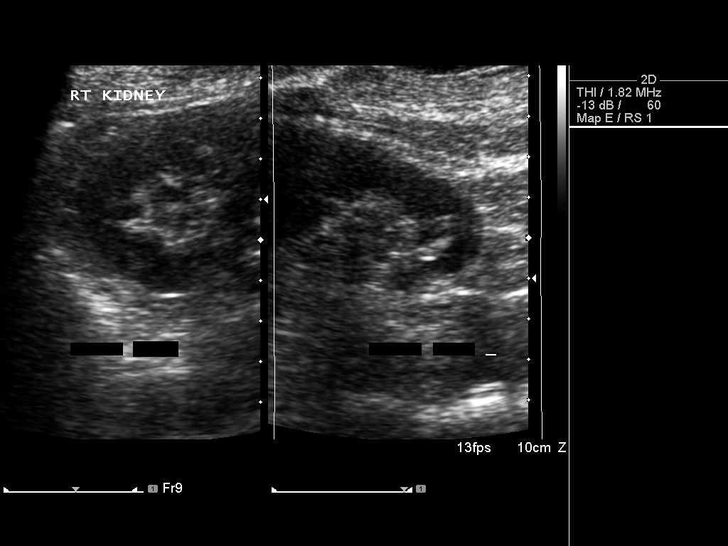
[im 50/67]
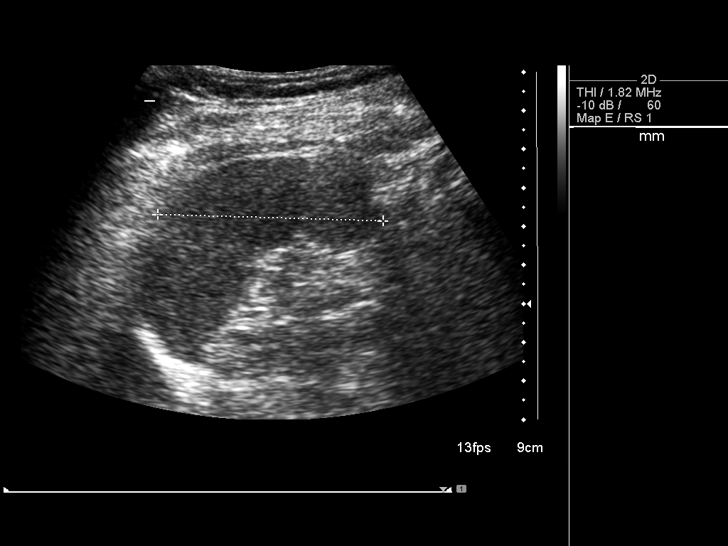
[im 56/67]
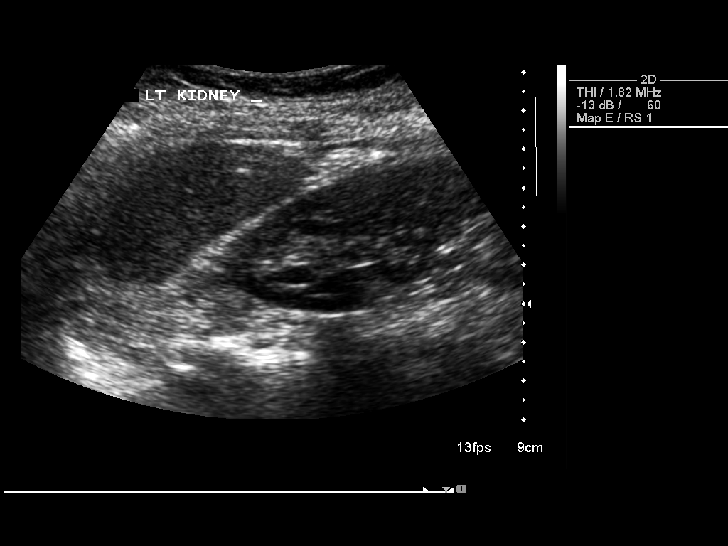
[im 61/67]
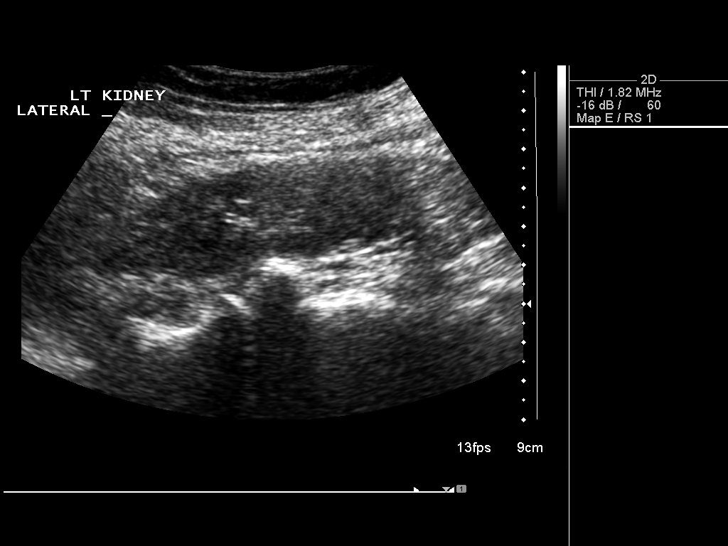
[im 67/67]
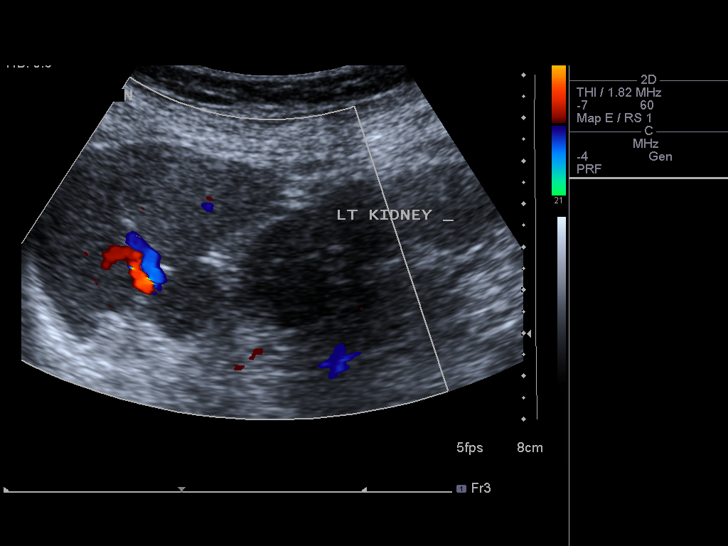

[14 of 25 positions shown; findings below may reference images not displayed]

FINDINGS: Gallbladder:  No gallstones, gallbladder wall thickening, or
pericholecystic fluid.

Common Bile Duct:  Within normal limits in caliber.

Liver: Heterogeneous echotexture is stable.  No focal mass.

IVC:  Appears normal.

Pancreas:  No abnormality identified.

Spleen:  Within normal limits in size and echotexture.

Right kidney:  Normal in size and parenchymal echogenicity.  No
evidence of mass or hydronephrosis.

Left kidney:  Normal in size and parenchymal echogenicity.  No
evidence of mass or hydronephrosis.

Abdominal Aorta:  No aneurysm identified.
IMPRESSION: Stable heterogeneity of the liver parenchyma without focal mass.

## 2016-03-24 ENCOUNTER — Encounter

## 2016-04-01 ENCOUNTER — Inpatient Hospital Stay: Admit: 2016-04-01 | Payer: MEDICARE | Attending: Surgery | Primary: Family Medicine

## 2016-04-01 DIAGNOSIS — I70223 Atherosclerosis of native arteries of extremities with rest pain, bilateral legs: Secondary | ICD-10-CM

## 2016-04-01 LAB — CREATININE, POC
Creatinine (POC): 0.5 MG/DL — ABNORMAL LOW (ref 0.6–1.3)
GFRAA, POC: >60 mL/min/{1.73_m2} (ref >60)
GFRNA, POC: >60 mL/min/{1.73_m2} (ref >60)

## 2016-04-01 MED ORDER — IOPAMIDOL 76 % IV SOLN
370 mg iodine /mL (76 %) | Freq: Once | INTRAVENOUS | Status: AC
Start: 2016-04-01 — End: 2016-04-01
  Administered 2016-04-01: 14:00:00 via INTRAVENOUS

## 2016-04-01 MED ORDER — SODIUM CHLORIDE 0.9 % IJ SYRG
Freq: Once | INTRAMUSCULAR | Status: AC
Start: 2016-04-01 — End: 2016-04-01
  Administered 2016-04-01: 14:00:00 via INTRAVENOUS

## 2016-04-01 MED ORDER — SODIUM CHLORIDE 0.9% BOLUS IV
0.9 % | Freq: Once | INTRAVENOUS | Status: AC
Start: 2016-04-01 — End: 2016-04-01
  Administered 2016-04-01: 14:00:00 via INTRAVENOUS

## 2016-04-01 MED FILL — NORMAL SALINE FLUSH 0.9 % INJECTION SYRINGE: INTRAMUSCULAR | Qty: 10

## 2016-04-01 MED FILL — SODIUM CHLORIDE 0.9 % IV: INTRAVENOUS | Qty: 100

## 2016-04-01 MED FILL — ISOVUE-370  76 % INTRAVENOUS SOLUTION: 370 mg iodine /mL (76 %) | INTRAVENOUS | Qty: 100

## 2016-04-15 ENCOUNTER — Inpatient Hospital Stay: Admit: 2016-04-15 | Payer: MEDICARE | Attending: Surgery | Primary: Family Medicine

## 2016-04-15 ENCOUNTER — Inpatient Hospital Stay: Admit: 2016-04-15 | Payer: MEDICARE | Primary: Family Medicine

## 2016-04-15 DIAGNOSIS — Z01818 Encounter for other preprocedural examination: Secondary | ICD-10-CM

## 2016-04-15 LAB — CBC WITH AUTOMATED DIFF
ABS. BASOPHILS: 0 10*3/uL (ref 0.0–0.1)
ABS. EOSINOPHILS: 0 10*3/uL (ref 0.0–0.4)
ABS. LYMPHOCYTES: 3.2 10*3/uL (ref 0.8–3.5)
ABS. MONOCYTES: 0.5 10*3/uL (ref 0.0–1.0)
ABS. NEUTROPHILS: 5.3 10*3/uL (ref 1.8–8.0)
BASOPHILS: 0 % (ref 0–1)
EOSINOPHILS: 0 % (ref 0–7)
HCT: 35.6 % (ref 35.0–47.0)
HGB: 12.1 g/dL (ref 11.5–16.0)
LYMPHOCYTES: 35 % (ref 12–49)
MCH: 35.6 PG — ABNORMAL HIGH (ref 26.0–34.0)
MCHC: 34 g/dL (ref 30.0–36.5)
MCV: 104.7 FL — ABNORMAL HIGH (ref 80.0–99.0)
MONOCYTES: 5 % (ref 5–13)
NEUTROPHILS: 60 % (ref 32–75)
PLATELET: 351 10*3/uL (ref 150–400)
RBC: 3.4 M/uL — ABNORMAL LOW (ref 3.80–5.20)
RDW: 14.8 % — ABNORMAL HIGH (ref 11.5–14.5)
WBC: 9.1 10*3/uL (ref 3.6–11.0)

## 2016-04-15 LAB — METABOLIC PANEL, COMPREHENSIVE
A-G Ratio: 1.2 (ref 1.1–2.2)
ALT (SGPT): 32 U/L (ref 12–78)
AST (SGOT): 30 U/L (ref 15–37)
Albumin: 4 g/dL (ref 3.5–5.0)
Alk. phosphatase: 121 U/L — ABNORMAL HIGH (ref 45–117)
Anion gap: 8 mmol/L (ref 5–15)
BUN/Creatinine ratio: 17 (ref 12–20)
BUN: 10 MG/DL (ref 6–20)
Bilirubin, total: 0.7 MG/DL (ref 0.2–1.0)
CO2: 29 mmol/L (ref 21–32)
Calcium: 9.3 MG/DL (ref 8.5–10.1)
Chloride: 95 mmol/L — ABNORMAL LOW (ref 97–108)
Creatinine: 0.58 MG/DL (ref 0.55–1.02)
GFR est AA: >60 mL/min/{1.73_m2} (ref >60)
GFR est non-AA: >60 mL/min/{1.73_m2} (ref >60)
Globulin: 3.4 g/dL (ref 2.0–4.0)
Glucose: 113 mg/dL — ABNORMAL HIGH (ref 65–100)
Potassium: 3.6 mmol/L (ref 3.5–5.1)
Protein, total: 7.4 g/dL (ref 6.4–8.2)
Sodium: 132 mmol/L — ABNORMAL LOW (ref 136–145)

## 2016-04-15 LAB — PTT: aPTT: 28.3 s (ref 22.1–32.5)

## 2016-04-15 LAB — PROTHROMBIN TIME + INR
INR: 1.1 (ref 0.9–1.1)
Prothrombin time: 11.2 s — ABNORMAL HIGH (ref 9.0–11.1)

## 2016-04-15 NOTE — Other (Signed)
[facility]  PREOPERATIVE INSTRUCTIONS    Surgery Date:   04/21/2016      Surgery arrival time given by surgeon: NO  (If ???no???,[facility] staff will call you between 3pm - 7pm the day before surgery with your arrival time. If your surgery is on a Monday, we will call you the preceding Friday.  Please call [phone] after 7pm if you did not receive your arrival time.)  1. Report  to the 2nd Floor Admitting Desk on the day of your surgery.  Bring your insurance card, photo identification, and any copayment (if applicable).  2. You must have a responsible adult to drive you home and stay with you the first 24 hours after surgery if you are going home the same day of your surgery.   3. Nothing to eat or drink after midnight the night before surgery. This means NO water, gum, mints, coffee, juice, etc.    4. MEDICATIONS TO TAKE THE MORNING OF SURGERY WITH A SIP OF WATER:none  5. No alcoholic beverages 24 hours before and after your surgery.  6. If you are being admitted to the hospital,please leave personal belongings/luggage in your car until you have an assigned hospital room number.( The hospital discharge time is 12 PM NOON. Your adult driver should be at the hospital prior to the noon discharge time unless otherwise instructed.)   7. STOP Aspirin and/or any non-steroidal anti-inflammatory drugs (i.e. Ibuprofen, Naproxen, Advil, Aleve) as directed by your surgeon.  You may take Tylenol.  Stop herbal supplements 1 week prior to  surgery.  8. If you are currently taking Plavix, Coumadin,or any other blood-thinning/ anticoagulant medication contact your surgeon for instructions.  9. Wear comfortable clothes. Wear your glasses instead of contacts. Please leave all money, jewelry and valuables at home. No make up, particularly mascara, the day of surgery.   10.  REMOVE ALL body piercings, rings,and jewelry and leave at home.  Wear your hair loose or down, no pony-tails, buns, or any metal hair clips.    11. If you shower the morning of surgery, please do not apply any lotions, powders, or deodorants afterwards.   Do not shave any body area within 24 hours of your surgery.  12. Please follow all instructions to avoid any potential surgical cancellation.  13.  Should your physical condition change, (i.e. fever, cold, flu, etc.) please notify your surgeon as soon as possible.  14. It is important to be on time. If a situation occurs where you may be delayed, please call:  [phone] / on the day of surgery.  15. The Preadmission Testing staff can be reached at [phone].  16. Special instructions: free valet parking,Bring completed PTA medication list with you on the day of your surgery.  17. The patient was contacted  in person. She  verbalize  understanding of all instructions does not  need reinforcement.

## 2016-04-17 LAB — EKG, 12 LEAD, INITIAL
Atrial Rate: 102 {beats}/min
Calculated P Axis: 57 degrees
Calculated R Axis: 37 degrees
Calculated T Axis: 61 degrees
P-R Interval: 130 ms
Q-T Interval: 338 ms
QRS Duration: 68 ms
QTC Calculation (Bezet): 440 ms
Ventricular Rate: 102 {beats}/min

## 2016-04-21 ENCOUNTER — Inpatient Hospital Stay: Admit: 2016-04-21 | Payer: MEDICARE | Primary: Family Medicine

## 2016-04-21 ENCOUNTER — Inpatient Hospital Stay: Admit: 2016-04-21 | Discharge: 2016-05-18 | Disposition: E | Payer: MEDICARE | Attending: Surgery | Admitting: Surgery

## 2016-04-21 ENCOUNTER — Inpatient Hospital Stay: Admit: 2016-04-22 | Payer: MEDICARE | Primary: Family Medicine

## 2016-04-21 DIAGNOSIS — I70223 Atherosclerosis of native arteries of extremities with rest pain, bilateral legs: Secondary | ICD-10-CM

## 2016-04-21 LAB — METABOLIC PANEL, BASIC
Anion gap: 8 mmol/L (ref 5–15)
BUN/Creatinine ratio: 18 (ref 12–20)
BUN: 9 MG/DL (ref 6–20)
CO2: 23 mmol/L (ref 21–32)
Calcium: 7.3 MG/DL — ABNORMAL LOW (ref 8.5–10.1)
Chloride: 105 mmol/L (ref 97–108)
Creatinine: 0.5 MG/DL — ABNORMAL LOW (ref 0.55–1.02)
GFR est AA: >60 mL/min/{1.73_m2} (ref >60)
GFR est non-AA: >60 mL/min/{1.73_m2} (ref >60)
Glucose: 225 mg/dL — ABNORMAL HIGH (ref 65–100)
Potassium: 4 mmol/L (ref 3.5–5.1)
Sodium: 136 mmol/L (ref 136–145)

## 2016-04-21 LAB — CBC W/O DIFF
HCT: 19.7 % — ABNORMAL LOW (ref 35.0–47.0)
HGB: 6.6 g/dL — ABNORMAL LOW (ref 11.5–16.0)
MCH: 36.3 PG — ABNORMAL HIGH (ref 26.0–34.0)
MCHC: 33.5 g/dL (ref 30.0–36.5)
MCV: 108.2 FL — ABNORMAL HIGH (ref 80.0–99.0)
PLATELET: 230 10*3/uL (ref 150–400)
RBC: 1.82 M/uL — ABNORMAL LOW (ref 3.80–5.20)
RDW: 15.6 % — ABNORMAL HIGH (ref 11.5–14.5)
WBC: 19.3 10*3/uL — ABNORMAL HIGH (ref 3.6–11.0)

## 2016-04-21 MED ORDER — METOPROLOL TARTRATE 5 MG/5 ML IV SOLN
5 mg/ mL | INTRAVENOUS | Status: DC | PRN
Start: 2016-04-21 — End: 2016-04-21
  Administered 2016-04-21: 22:00:00 via INTRAVENOUS

## 2016-04-21 MED ORDER — SODIUM CHLORIDE 0.9 % IV
10 mg/mL | INTRAVENOUS | Status: DC | PRN
Start: 2016-04-21 — End: 2016-04-21
  Administered 2016-04-21 (×10): via INTRAVENOUS

## 2016-04-21 MED ORDER — HYDROMORPHONE (PF) 1 MG/ML IJ SOLN
1 mg/mL | INTRAMUSCULAR | Status: DC | PRN
Start: 2016-04-21 — End: 2016-04-21

## 2016-04-21 MED ORDER — SODIUM CHLORIDE 0.9 % IV
INTRAVENOUS | Status: DC | PRN
Start: 2016-04-21 — End: 2016-04-22

## 2016-04-21 MED ORDER — LIDOCAINE (PF) 10 MG/ML (1 %) IJ SOLN
10 mg/mL (1 %) | INTRAMUSCULAR | Status: DC | PRN
Start: 2016-04-21 — End: 2016-04-21

## 2016-04-21 MED ORDER — CEFAZOLIN 1 GRAM SOLUTION FOR INJECTION
1 gram | INTRAMUSCULAR | Status: DC | PRN
Start: 2016-04-21 — End: 2016-04-21
  Administered 2016-04-21: 18:00:00 via INTRAVENOUS

## 2016-04-21 MED ORDER — HYDROMORPHONE (PF) 2 MG/ML IJ SOLN
2 mg/mL | INTRAMUSCULAR | Status: DC | PRN
Start: 2016-04-21 — End: 2016-04-21
  Administered 2016-04-21 (×2): via INTRAVENOUS

## 2016-04-21 MED ORDER — PHENYLEPHRINE 10 MG/ML INJECTION
10 mg/mL | INTRAMUSCULAR | Status: DC | PRN
Start: 2016-04-21 — End: 2016-04-21
  Administered 2016-04-21 (×4): via INTRAVENOUS

## 2016-04-21 MED ORDER — FENTANYL CITRATE (PF) 50 MCG/ML IJ SOLN
50 mcg/mL | INTRAMUSCULAR | Status: AC
Start: 2016-04-21 — End: ?

## 2016-04-21 MED ORDER — SODIUM CHLORIDE 0.9 % IJ SYRG
INTRAMUSCULAR | Status: DC | PRN
Start: 2016-04-21 — End: 2016-04-21

## 2016-04-21 MED ORDER — SODIUM CHLORIDE 0.9 % IJ SYRG
Freq: Three times a day (TID) | INTRAMUSCULAR | Status: DC
Start: 2016-04-21 — End: 2016-04-21
  Administered 2016-04-21: 18:00:00 via INTRAVENOUS

## 2016-04-21 MED ORDER — MIDAZOLAM 1 MG/ML IJ SOLN
1 mg/mL | INTRAMUSCULAR | Status: AC
Start: 2016-04-21 — End: ?

## 2016-04-21 MED ORDER — D5-1/2 NS & POTASSIUM CHLORIDE 20 MEQ/L IV
20 mEq/L | INTRAVENOUS | Status: DC
Start: 2016-04-21 — End: 2016-04-21
  Administered 2016-04-21: 22:00:00 via INTRAVENOUS

## 2016-04-21 MED ORDER — PROPOFOL 10 MG/ML IV EMUL
10 mg/mL | INTRAVENOUS | Status: DC | PRN
Start: 2016-04-21 — End: 2016-04-21
  Administered 2016-04-21 (×2): via INTRAVENOUS

## 2016-04-21 MED ORDER — LACTATED RINGERS IV
INTRAVENOUS | Status: DC | PRN
Start: 2016-04-21 — End: 2016-04-21
  Administered 2016-04-21 (×2): via INTRAVENOUS

## 2016-04-21 MED ORDER — SUCCINYLCHOLINE CHLORIDE 20 MG/ML INJECTION
20 mg/mL | INTRAMUSCULAR | Status: DC | PRN
Start: 2016-04-21 — End: 2016-04-21
  Administered 2016-04-21: 18:00:00 via INTRAVENOUS

## 2016-04-21 MED ORDER — SODIUM CHLORIDE 0.9 % IV
10 mg/mL | INTRAVENOUS | Status: DC
Start: 2016-04-21 — End: 2016-04-22
  Administered 2016-04-21 – 2016-04-22 (×4): via INTRAVENOUS

## 2016-04-21 MED ORDER — FENTANYL CITRATE (PF) 50 MCG/ML IJ SOLN
50 mcg/mL | INTRAMUSCULAR | Status: DC | PRN
Start: 2016-04-21 — End: 2016-04-21
  Administered 2016-04-21 (×3): via INTRAVENOUS

## 2016-04-21 MED ORDER — CEFAZOLIN 2 GRAM/50 ML NS IVPB
Freq: Three times a day (TID) | INTRAVENOUS | Status: DC
Start: 2016-04-21 — End: 2016-04-22
  Administered 2016-04-22: 08:00:00 via INTRAVENOUS

## 2016-04-21 MED ORDER — PHENYLEPHRINE 10 MG/ML INJECTION
10 mg/mL | INTRAMUSCULAR | Status: DC
Start: 2016-04-21 — End: 2016-04-21
  Administered 2016-04-21 (×5): via INTRAVENOUS

## 2016-04-21 MED ORDER — MIDAZOLAM 1 MG/ML IJ SOLN
1 mg/mL | INTRAMUSCULAR | Status: DC | PRN
Start: 2016-04-21 — End: 2016-04-21
  Administered 2016-04-21: 18:00:00 via INTRAVENOUS

## 2016-04-21 MED ORDER — CEFAZOLIN 2 GRAM/50 ML NS IVPB
INTRAVENOUS | Status: DC
Start: 2016-04-21 — End: 2016-04-21
  Administered 2016-04-21: 17:00:00 via INTRAVENOUS

## 2016-04-21 MED ORDER — EPHEDRINE SULFATE 50 MG/ML IJ SOLN
50 mg/mL | INTRAMUSCULAR | Status: DC | PRN
Start: 2016-04-21 — End: 2016-04-21
  Administered 2016-04-21 (×2): via INTRAVENOUS

## 2016-04-21 MED ORDER — LACTATED RINGERS IV
INTRAVENOUS | Status: DC
Start: 2016-04-21 — End: 2016-04-21
  Administered 2016-04-21: 22:00:00 via INTRAVENOUS

## 2016-04-21 MED ORDER — LACTATED RINGERS IV
INTRAVENOUS | Status: DC
Start: 2016-04-21 — End: 2016-04-21
  Administered 2016-04-21 (×2): via INTRAVENOUS

## 2016-04-21 MED ORDER — DEXAMETHASONE SODIUM PHOSPHATE 4 MG/ML IJ SOLN
4 mg/mL | INTRAMUSCULAR | Status: AC
Start: 2016-04-21 — End: ?

## 2016-04-21 MED ORDER — SODIUM CHLORIDE 0.9 % IV
1000 unit/mL | INTRAVENOUS | Status: DC | PRN
Start: 2016-04-21 — End: 2016-04-21
  Administered 2016-04-21: 19:00:00

## 2016-04-21 MED ORDER — PROTAMINE 10 MG/ML IV
10 mg/mL | INTRAVENOUS | Status: DC | PRN
Start: 2016-04-21 — End: 2016-04-21
  Administered 2016-04-21: 21:00:00 via INTRAVENOUS

## 2016-04-21 MED ORDER — HEPARIN (PORCINE) IN NS (PF) 1,000 UNIT/500 ML IV
1000 unit/500 mL | INTRAVENOUS | Status: AC
Start: 2016-04-21 — End: ?

## 2016-04-21 MED ORDER — ROCURONIUM 10 MG/ML IV
10 mg/mL | INTRAVENOUS | Status: DC | PRN
Start: 2016-04-21 — End: 2016-04-21
  Administered 2016-04-21 (×4): via INTRAVENOUS

## 2016-04-21 MED ORDER — HEPARIN (PORCINE) 1,000 UNIT/ML IJ SOLN
1000 unit/mL | INTRAMUSCULAR | Status: DC | PRN
Start: 2016-04-21 — End: 2016-04-21
  Administered 2016-04-21: 19:00:00 via INTRAVENOUS

## 2016-04-21 MED ORDER — SODIUM CHLORIDE 0.9 % IV
INTRAVENOUS | Status: DC | PRN
Start: 2016-04-21 — End: 2016-04-21

## 2016-04-21 MED ORDER — LIDOCAINE (PF) 20 MG/ML (2 %) IJ SOLN
20 mg/mL (2 %) | INTRAMUSCULAR | Status: DC | PRN
Start: 2016-04-21 — End: 2016-04-21
  Administered 2016-04-21: 18:00:00 via INTRAVENOUS

## 2016-04-21 MED ORDER — ONDANSETRON (PF) 4 MG/2 ML INJECTION
4 mg/2 mL | INTRAMUSCULAR | Status: AC
Start: 2016-04-21 — End: ?

## 2016-04-21 MED ORDER — NEOSTIGMINE METHYLSULFATE 1 MG/ML INJECTION
1 mg/mL | INTRAMUSCULAR | Status: DC | PRN
Start: 2016-04-21 — End: 2016-04-21
  Administered 2016-04-21: 21:00:00 via INTRAVENOUS

## 2016-04-21 MED ORDER — GLYCOPYRROLATE 0.2 MG/ML IJ SOLN
0.2 mg/mL | INTRAMUSCULAR | Status: DC | PRN
Start: 2016-04-21 — End: 2016-04-21
  Administered 2016-04-21: 21:00:00 via INTRAVENOUS

## 2016-04-21 MED ORDER — DEXAMETHASONE SODIUM PHOSPHATE 4 MG/ML IJ SOLN
4 mg/mL | INTRAMUSCULAR | Status: DC | PRN
Start: 2016-04-21 — End: 2016-04-21
  Administered 2016-04-21: 18:00:00 via INTRAVENOUS

## 2016-04-21 MED ORDER — HYDROMORPHONE (PF) 2 MG/ML IJ SOLN
2 mg/mL | INTRAMUSCULAR | Status: AC
Start: 2016-04-21 — End: ?

## 2016-04-21 MED FILL — DEXAMETHASONE SODIUM PHOSPHATE 4 MG/ML IJ SOLN: 4 mg/mL | INTRAMUSCULAR | Qty: 1

## 2016-04-21 MED FILL — SODIUM CHLORIDE 0.9 % IV: INTRAVENOUS | Qty: 250

## 2016-04-21 MED FILL — BD POSIFLUSH NORMAL SALINE 0.9 % INJECTION SYRINGE: INTRAMUSCULAR | Qty: 10

## 2016-04-21 MED FILL — PHENYLEPHRINE 10 MG/ML INJECTION: 10 mg/mL | INTRAMUSCULAR | Qty: 10

## 2016-04-21 MED FILL — D5-1/2 NS & POTASSIUM CHLORIDE 20 MEQ/L IV: 20 mEq/L | INTRAVENOUS | Qty: 1000

## 2016-04-21 MED FILL — FENTANYL CITRATE (PF) 50 MCG/ML IJ SOLN: 50 mcg/mL | INTRAMUSCULAR | Qty: 5

## 2016-04-21 MED FILL — ONDANSETRON (PF) 4 MG/2 ML INJECTION: 4 mg/2 mL | INTRAMUSCULAR | Qty: 2

## 2016-04-21 MED FILL — MIDAZOLAM 1 MG/ML IJ SOLN: 1 mg/mL | INTRAMUSCULAR | Qty: 2

## 2016-04-21 MED FILL — LACTATED RINGERS IV: INTRAVENOUS | Qty: 1000

## 2016-04-21 MED FILL — CEFAZOLIN 2 GRAM/50 ML NS IVPB: INTRAVENOUS | Qty: 50

## 2016-04-21 MED FILL — VAZCULEP 10 MG/ML INJECTION SOLUTION: 10 mg/mL | INTRAMUSCULAR | Qty: 3

## 2016-04-21 MED FILL — HYDROMORPHONE (PF) 2 MG/ML IJ SOLN: 2 mg/mL | INTRAMUSCULAR | Qty: 1

## 2016-04-21 MED FILL — HEPARIN (PORCINE) IN NS (PF) 1,000 UNIT/500 ML IV: 1000 unit/500 mL | INTRAVENOUS | Qty: 500

## 2016-04-21 NOTE — Other (Addendum)
TRANSFER - OUT REPORT:    Verbal report given to Harriett SineNancy, RN on Diane Burke  being transferred to ICU 8 for routine post - op       Report consisted of patient???s Situation, Background, Assessment and   Recommendations(SBAR).     Information from the following report(s) SBAR, OR Summary, Intake/Output, MAR, Recent Results and Cardiac Rhythm NSR was reviewed with the receiving nurse.    Lines:   Triple Lumen 2015-11-23 Right Internal jugular (Active)       Peripheral IV 2015-11-23 Right Antecubital (Active)   Site Assessment Clean, dry, & intact 04/29/2016  5:17 PM   Phlebitis Assessment 0 04/19/2016  5:17 PM   Infiltration Assessment 0 05/09/2016  5:17 PM   Dressing Status Clean, dry, & intact 05/15/2016  5:17 PM   Dressing Type Transparent;Tape 05/13/2016  5:17 PM   Hub Color/Line Status Blue;Patent;Infusing 05/05/2016  5:17 PM   Action Taken Open ports on tubing capped 04/19/2016 12:55 PM   Alcohol Cap Used Yes 04/20/2016  5:17 PM       Peripheral IV 2015-11-23 Left Forearm (Active)   Site Assessment Clean, dry, & intact 05/04/2016  5:17 PM   Phlebitis Assessment 0 04/27/2016  5:17 PM   Infiltration Assessment 0 04/20/2016  5:17 PM   Dressing Status Clean, dry, & intact 04/20/2016  5:17 PM   Dressing Type Transparent;Tape 05/10/2016  5:17 PM   Hub Color/Line Status Pink;Patent;Infusing 05/08/2016  5:17 PM   Alcohol Cap Used Yes 04/20/2016  5:17 PM       Arterial Line Left Radial artery (Active)   Site Assessment Clean, dry, & intact 04/26/2016  5:17 PM   Dressing Status Clean, dry, & intact 05/15/2016  5:17 PM   Dressing Type Transparent;Tape 05/11/2016  5:17 PM   Line Status Intact and in place 05/10/2016  5:17 PM   Treatment Zeroed or re-zeroed 04/23/2016  5:17 PM   Affected Extremity/Extremities Color distal to insertion site pink (or appropriate for race) 05/05/2016  5:17 PM        Opportunity for questions and clarification was provided.      Patient transported with:   Monitor  O2 @ 2 liters  Registered Nurse             Pt.  blood pressures decreased again during transport. BP 90's over 40's on transfer, 70's-80's/40's on arrival.  Also on arrival to ICU, central line noted to be pulled back farther than when placed. Dr. Meredith ModyStein notified, states he will evaluate and resuture when available. Additional peripheral site placed by Josie SaundersGerard, RN, for blood administration in the interim.  ICU nurses notified that Dr. Isabel CapricePeltz wished to be called on patient's arrival to ICU.

## 2016-04-21 NOTE — Anesthesia Procedure Notes (Signed)
Central Line Placement    Start time: 04/29/2016 4:55 PM  End time: 05/14/2016 7:14 PM  Performed by: Glendora ScoreSTEIN, Jola Critzer  Authorized by: Satira MccallumAVIS, JASON     Indications: vascular access and need for vasopressors  Preanesthetic Checklist: patient identified, risks and benefits discussed, anesthesia consent, patient being monitored and timeout performed      Pre-procedure:   All elements of maximal sterile barrier technique followed? Yes    Maximal barrier precautions followed, 2% Chlorhexidine for cutaneous antisepsis, Hand hygiene performed prior to catheter insertion and Ultrasound guidance    Sterile Ultrasound Technique followed?: Yes          Procedure:   Prep:  Chlorhexidine  Location:  Internal jugular  Orientation:  Right  Patient position:  Trendelenburg  Catheter type:  Triple lumen  Catheter size:  7 Fr  Catheter length:  16 cm  Number of attempts:  1  Successful placement: Yes      Assessment:   Post-procedure:  Catheter secured, sterile dressing applied and sterile dressing with CHG applied  Assessment:  Placement verified by x-ray, blood return through all ports, guidewire removal verified and free fluid flow  Insertion:  Uncomplicated  Patient tolerance:  Patient tolerated the procedure well with no immediate complications  ASA monitors placed, nasal cannula O2, sedation titrated to effect. Seldinger technique, venous access verified by gravity.

## 2016-04-21 NOTE — Progress Notes (Signed)
Pt arrived to ICU after [place]  Pt MAP in the 50's with

## 2016-04-21 NOTE — Other (Signed)
Intensivist consult called. Spoke with Dr. Isabel CapricePeltz.

## 2016-04-21 NOTE — Progress Notes (Addendum)
Pt arrived to ICU after [place]  Pt MAP in the 50's with ART line and Automatic BP cuff.  Pt unable to get temp oral or axillary. Bladder temp 93.9.  Pt central line not at insertion mark.  Dr. Meredith ModyStein notified and will come up to see patient later.  PACU started IV in right arm to give blood now.  Blood transfusion started on arrival.      2200 paged and spoke with Dr. Katina DegreeMukherjie, on call for surgery [employer] and updated MD on patient.  Paged Dr. Isabel CapricePeltz.  Per Dr. Isabel CapricePeltz stat CXR.  Set up CVP.  Start Levophed drip and give an additional unit of PRBC's.  .  Will follow through with orders and continue to monitor.    2200 Josie SaundersGerard RN with PACU called unit he has spoken with Dr Meredith ModyStein and per Dr. Meredith ModyStein it is OK to use central line for now he will be up in one hour.    2200 Dr Isabel CapricePeltz to bedside to see patient and assess. Ordered ABG.  Pt acidotic ordered 2amps bicarb and drip.  Started. Calcium Gluconate when access.  Held asa and heparin.  Held po meds.  Will give IV antibiotics when access available.  Levophed started with neo already going    2300 Dr. Meredith ModyStein to bedside to change central line over a wire.  CXR ordered to check placement.  Pt skin tear when tegaderm removed from neck.  Per MD cover with tegaderm and tegaderm also applied to central line with disk.  Per Dr. Meredith ModyStein OK to use PIV for levophed and neo while changed over a wire. CXR done 2nd unit of blood hung.  Calcium gluconate hung.  CVP set up 1.  Paged Dr. Delane GingerGill updated him on patient.  Per Dr. Delane GingerGill give 500 cc fluid bolus and wean off of Levophed first.    0345 Pt BP still to low to wean off of pressors.  Pt HR 140.  Paged Dr. Delane GingerGill to update him on patient condition.  Informed MD that pressors had not been weaned and 2nd of 2 units of blood not complete until 3am.  Per Dr. Delane GingerGill waite until 4 to draw labs.    0400 labs drawn    0436 Pt coded times 2 see code blue documentation.  Pt son called and is  [place] way [place]  Surgeon and pulmonary called.  Dr. Eulah PontMurphy to bedside    0600 Pt son to ICU spoke with pastoral care , Dr. Eulah PontMurphy and surgeon on call.  Pt son changed code status to DNR.      0630 Withdrew care per son request.  [facility] called.     0700 Dr. Mylo Redefera to bedside pronounce.

## 2016-04-21 NOTE — Anesthesia Pre-Procedure Evaluation (Signed)
Anesthetic History   No history of anesthetic complications            Review of Systems / Medical History  Patient summary reviewed and pertinent labs reviewed    Pulmonary          Smoker         Neuro/Psych         Psychiatric history     Cardiovascular    Hypertension          PAD         GI/Hepatic/Renal           Liver disease    Comments: Alcoholic cirrhosis Endo/Other             Other Findings              Physical Exam    Airway  Mallampati: II  TM Distance: 4 - 6 cm  Neck ROM: normal range of motion   Mouth opening: Normal     Cardiovascular  Regular rate and rhythm,  S1 and S2 normal,  no murmur, click, rub, or gallop  Rhythm: regular  Rate: normal         Dental    Dentition: Edentulous     Pulmonary  Breath sounds clear to auscultation               Abdominal  GI exam deferred       Other Findings            Anesthetic Plan    ASA: 3  Anesthesia type: general    Monitoring Plan: Arterial line      Induction: Intravenous  Anesthetic plan and risks discussed with: Patient

## 2016-04-21 NOTE — Consults (Signed)
Initial Pulmonary / Critical Care Consultation    Assessment:   1. Acute hypoxemic respiratory failure   2. Shock   3. Severe PVD s/p aorto-bifem bypass 10/5   4. Anemia (?acute blood loss)   5. H/o cirrhosis   6. Hyperglycemia    Plan:  1. Supplemental O2 to keep sats > 90%  2. ABG now. May need bicarb  3. Titrate pressors to keep MAP > 65  4. Check CVP, bolus prn  5. IVF  6. Check cardiac enzymes, lactate, INR, fibrinogen  7. Trend Hgb, transfuse to keep > 8  8. Echo  9. Replete lytes  10. Hold sq heparin until Hgb stabilizes    History / Subjective:  Reason:  Post-op hypotension    Diane Burke is a 65 y.o.  female who  has a past medical history of CAD (coronary artery disease); Chronic pain; Cirrhosis, alcoholic (HCC) (2013); Hypertension; Ill-defined condition; and Liver disease. admitted 05/17/2016 s/p elective aorto-bifem bypass. Has been hypotensive post-op and is requiring neo and levophed to maintain BP. CVC placed by anesthesia. Received 5L IVF and albumin in the PACU. Is also hypothermic (T 93.8) and anemic (Hgb 6.6, down from 12.1 pre-op).      She is currently awake and oriented. Denies shortness of breath or chest pain. Does c/o back pain.     No Known Allergies  Past Medical History:   Diagnosis Date   ??? CAD (coronary artery disease)    ??? Chronic pain     both legs   ??? Cirrhosis, alcoholic (HCC) 2013   ??? Hypertension    ??? Ill-defined condition     anxiety and depression   ??? Liver disease       Past Surgical History:   Procedure Laterality Date   ??? HX GYN Right 1990s    vulvar CA   ??? HX OTHER SURGICAL Bilateral 2010    femoral stents      Prior to Admission medications    Medication Sig Start Date End Date Taking? Authorizing Provider   diclofenac EC (VOLTAREN) 75 mg EC tablet Take  by mouth two (2) times a day.   Yes Historical Provider   buPROPion SR [facility](WELLBUTRIN SR) 150 mg SR tablet Take  by mouth daily.   Yes Historical Provider    aspirin (ASPIRIN) 325 mg tablet Take 325 mg by mouth daily.   Yes Historical Provider   traMADol (ULTRAM) 50 mg tablet Take 50 mg by mouth every six (6) hours as needed for Pain.   Yes Historical Provider   pravastatin (PRAVACHOL) 10 mg tablet Take  by mouth daily.    Historical Provider      History reviewed. No pertinent family history.  Social History   Substance Use Topics   ??? Smoking status: Current Every Day Smoker     Packs/day: 1.00     Years: 53.00   ??? Smokeless tobacco: Never Used   ??? 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       Objective:  Patient Vitals for the past 4 hrs:   BP Temp Pulse Resp SpO2   March 26, 2016 2206 - - 97 21 -   March 26, 2016 2205 - - 98 24 -   March 26, 2016 2204 - - 99 22 -   March 26, 2016 2203 - - 100 21 -   March 26, 2016 2202 - - (!) 101 15 -   March 26, 2016 2201 - - (!) 102 25 -   March 26, 2016 2200 Marland Kitchen(!)  71/48 - (!) 102 21 -   2016-05-16 2159 - - (!) 104 26 -   05-16-16 2158 - - (!) 104 17 -   05/16/16 2157 - - (!) 105 26 -   05/16/16 2156 - - (!) 105 19 -   May 16, 2016 2155 - - (!) 103 16 -   05/16/2016 2154 - - (!) 101 18 -   2016-05-16 2153 - - 99 23 -   May 16, 2016 2152 - - 99 20 -   05-16-16 2151 - - 97 16 -   May 16, 2016 2149 - - 96 20 -   05/16/16 2148 - - 97 20 -   05-16-2016 2147 - - 98 22 -   16-May-2016 2146 - - 97 21 -   05/16/16 2145 (!) 79/47 - 97 23 -   05/16/2016 2144 - - 97 18 -   May 16, 2016 2143 - - 98 23 -   05/16/2016 2142 - - 98 22 -   16-May-2016 2141 - - 97 19 -   05-16-2016 2140 - - 96 21 -   May 16, 2016 2139 - - 96 22 -   05/16/2016 2138 - - 97 21 -   May 16, 2016 2137 - - 97 21 -   May 16, 2016 2136 - - 96 18 -   05-16-2016 2135 - - 97 21 -   05/16/2016 2134 - - 98 23 -   2016-05-16 2133 - - 100 16 -   05-16-2016 2132 - - 97 20 -   May 16, 2016 2131 - - 96 15 -   May 16, 2016 2130 (!) 74/49 - 96 18 -   16-May-2016 2129 - - 96 20 -   2016/05/16 2128 - - 96 20 -   05/16/2016 2127 - - 96 16 -   16-May-2016 2126 - - 96 21 -   May 16, 2016 2125 - - 96 18 -   2016-05-16 2124 - - 95 21 -   05-16-2016 2123 - - 96 21 -   05-16-2016 2122 - - 96 22 -    05-16-16 2121 - - 97 18 -   2016-05-16 2120 (!) 71/50 - 96 21 -   2016/05/16 2119 - - 96 20 -   05/16/16 2117 - - 96 21 -   2016/05/16 2116 - - 97 19 -   05-16-16 2115 - - 96 21 -   May 16, 2016 2108 - - 97 22 -   16-May-2016 2107 - - 95 17 -   05-16-16 2100 (!) 70/44 (!) 94 ??F (34.4 ??C) 94 19 -   05/16/16 2037 - - 93 19 -   16-May-2016 2036 - - 91 17 -   05/16/16 2035 - - 92 17 -   05/16/16 2034 - - 90 20 -   05/16/16 2033 - - 92 17 -   05-16-2016 2032 - - 89 19 -   05-16-2016 2031 - - 88 10 -   2016/05/16 2030 - - 90 11 -   05/16/2016 2029 - - 89 13 -   2016-05-16 2028 - - 92 11 -   05-16-16 2027 - - 91 14 -   16-May-2016 2026 - - 93 13 -   2016/05/16 2025 (!) 88/56 (!) 93.9 ??F (34.4 ??C) 94 14 -   2016/05/16 2021 - - 89 - -   05/16/16 1930 90/46 96.9 ??F (36.1 ??C) 89 18 100 %   2016-05-16 1915 92/50 - 88 14 -   05-16-16 1901 - - 85 17 100 %  05/15/16 1900 95/46 - 84 18 -   05-15-16 1850 100/52 - 86 14 100 %   05/15/16 1845 96/50 - 87 15 100 %     Temp (24hrs), Avg:96.3 ??F (35.7 ??C), Min:93.9 ??F (34.4 ??C), Max:98.3 ??F (36.8 ??C)    CVP:          Intake/Output Summary (Last 24 hours) at 05-15-2016 2240  Last data filed at 05/15/16 1722   Gross per 24 hour   Intake             2900 ml   Output              355 ml   Net             2545 ml     Blood Sugar:  Glucose   Date Value Ref Range Status   05/15/16 225 (H) 65 - 100 mg/dL Final   78/46/9629 528 (H) 65 - 100 mg/dL Final     Exam:  General: awake, alert  HEENT: normal conjunctiva/sclera, PERRL, dry mucous membranes  Neck: RIJ CVC  CV: RRR, no murmur  Resp: CTAB  Abd: soft, appropriately tender, hypoactive bowel sounds, dsg c/d/i  Ext: dopplerable pulses, no edema  Neuro: oriented x 3, no motor deficits    Lab data was reviewed.    Radiology images were independently viewed and available reports were reviewed.      Pt. Is critically ill and at high risk for decompensation  CCT: 39 minutes      Cira Rue, MD

## 2016-04-21 NOTE — Anesthesia Post-Procedure Evaluation (Signed)
Post-Anesthesia Evaluation and Assessment    Patient: Diane InaShirley Haller MRN: [id number]  SSN: [id number]    Date of Birth: May 11, 1951  Age: 65 y.o.  Sex: female       Cardiovascular Function/Vital Signs  Visit Vitals   ??? BP 90/46 (BP 1 Location: Right arm, BP Patient Position: At rest)   ??? Pulse 89   ??? Temp 36.1 ??C (96.9 ??F)   ??? Resp 18   ??? Ht 5\' 1"  (1.549 m)   ??? Wt 44.5 kg (98 lb)   ??? SpO2 100%   ??? BMI 18.52 kg/m2       Patient is status post general anesthesia for Procedure(s):  AORTO BIFEMORAL BYPASS.    Nausea/Vomiting: None    Postoperative hydration reviewed and adequate.    Pain:  Pain Scale 1: Numeric (0 - 10) (10/17/2015 1930)  Pain Intensity 1: 0 (10/17/2015 1930)   Managed    Neurological Status:   Neuro (WDL): Exceptions to WDL (10/17/2015 1930)  Neuro  Neurologic State: Drowsy (10/17/2015 1930)   At baseline    Mental Status and Level of Consciousness: Arousable    Pulmonary Status:   O2 Device: Nasal cannula (10/17/2015 1930)   Adequate oxygenation and airway patent    Complications related to anesthesia: None    Post-anesthesia assessment completed. No concerns    Signed By: Glendora ScoreEthan Vinisha Faxon, MD     April 21, 2016

## 2016-04-21 NOTE — Anesthesia Procedure Notes (Signed)
Arterial Line Placement    Start time: 05/05/2016 2:01 PM  End time: 05/03/2016 2:05 PM  Performed by: Satira MccallumAVIS, Sheniece Ruggles  Authorized by: Satira MccallumAVIS, Tymeer Vaquera     Pre-Procedure  Indications:  Arterial pressure monitoring  Preanesthetic Checklist: patient identified, risks and benefits discussed, anesthesia consent, site marked, patient being monitored, timeout performed and patient being monitored      Procedure:   Prep:  Alcohol  Seldinger Technique?: Yes    Orientation:  Left  Location:  Radial artery  Catheter size:  20 G  Number of attempts:  1  Cont Cardiac Output Sensor: No      Assessment:   Post-procedure:  Line secured and sterile dressing applied  Patient Tolerance:  Patient tolerated the procedure well with no immediate complications

## 2016-04-21 NOTE — Other (Signed)
Called and spoke with DR.E Meredith ModyStein  RE ICU  Nurse request to revisit central line placement which was displaced on arrival from PACU to ICU,,Dr.Stein is currently providing care to  a c-section patient and advised that central line can be used  If it ls approximately 9cm out from point of insertion and he will visit the patient  In ICU After  THE c-section.

## 2016-04-21 NOTE — Other (Signed)
Pt. Received to PACU with questionable changes to her EKG rhythm, hypotension. EKG done, anesthesia at bedside and medicating.  EKG changes resolved quickly, persistent hypotension. CBC and BMP sent and patient bolused.  After 5L crystalloid total and 250ml Albumin, patient still requiring Neosynephrine at 34800mcg/min.  Anesthesia placed right IJ central line to infuse pressors. H/H 6.6/19.7. Dr. Meredith ModyStein notified, and one unit of PRBC's ordered.

## 2016-04-21 NOTE — Progress Notes (Signed)
2234-Chart Accessed for [employer] Purposes.     Kristan L. Price RN

## 2016-04-21 NOTE — Brief Op Note (Signed)
BRIEF OPERATIVE NOTE    Date of Procedure: 04/18/2016   Preoperative Diagnosis: PVD WITH RESTING PAIN  Postoperative Diagnosis: PVD WITH RESTING PAIN    Procedure(s):  AORTO BIFEMORAL BYPASS  Surgeon(s) and Role:     * Adah SalvageMarc T Dorna Mallet, MD - Primary         Assistant Staff:       Surgical Staff:  Circ-1: Nelida Goresavid R Sweat, RN  Circ-2: Theodoro KalataHolly R Seigler, RN  Scrub Tech-1: Ebony Hailhais Jensen  Scrub Tech-Relief: Alice ReichertAnthony L Dent; Elna BreslowMindi I Meyers  Surg Asst-1: Art Debbora DusJ Wells  Surg Asst-2: Rosalio LoudLeilani Settles  Float Staff: Sandrea HughsJuliann C Murphy  Event Time In   Incision Start 1423   Incision Close      Anesthesia: General   Estimated Blood Loss: 200  Specimens: * No specimens in log *   Findings: 0   Complications: 0  Implants:   Implant Name Type Inv. Item Serial No. Manufacturer Lot No. LRB No. Used Action   GRAFT HEMSHLD GLD [id number] --  - [id number]   GRAFT HEMSHLD GLD [id number] --  [id number] GETINGE [employer] MAQUET CARDIOVASCLR [id number] N/A 1 Implanted

## 2016-04-22 ENCOUNTER — Inpatient Hospital Stay: Admit: 2016-04-22 | Payer: MEDICARE | Primary: Family Medicine

## 2016-04-22 ENCOUNTER — Inpatient Hospital Stay: Payer: MEDICARE | Primary: Family Medicine

## 2016-04-22 LAB — TYPE & SCREEN
ABO/Rh(D): B POS
Antibody screen: NEGATIVE
Status of unit: TRANSFUSED
Status of unit: TRANSFUSED
Unit division: 0
Unit division: 0
Unit division: 0
Unit division: 0

## 2016-04-22 LAB — CBC WITH AUTOMATED DIFF
ABS. BASOPHILS: 0 10*3/uL (ref 0.0–0.1)
ABS. EOSINOPHILS: 0 10*3/uL (ref 0.0–0.4)
ABS. LYMPHOCYTES: 1.1 10*3/uL (ref 0.8–3.5)
ABS. MONOCYTES: 0.9 10*3/uL (ref 0.0–1.0)
ABS. NEUTROPHILS: 8.3 10*3/uL — ABNORMAL HIGH (ref 1.8–8.0)
BASOPHILS: 0 % (ref 0–1)
EOSINOPHILS: 0 % (ref 0–7)
HCT: 23.1 % — ABNORMAL LOW (ref 35.0–47.0)
HGB: 7.7 g/dL — ABNORMAL LOW (ref 11.5–16.0)
LYMPHOCYTES: 11 % — ABNORMAL LOW (ref 12–49)
MCH: 32.8 PG (ref 26.0–34.0)
MCHC: 33.3 g/dL (ref 30.0–36.5)
MCV: 98.3 FL (ref 80.0–99.0)
MONOCYTES: 9 % (ref 5–13)
NEUTROPHILS: 80 % — ABNORMAL HIGH (ref 32–75)
PLATELET: 94 10*3/uL — ABNORMAL LOW (ref 150–400)
RBC: 2.35 M/uL — ABNORMAL LOW (ref 3.80–5.20)
RDW: 18.2 % — ABNORMAL HIGH (ref 11.5–14.5)
WBC: 10.3 10*3/uL (ref 3.6–11.0)

## 2016-04-22 LAB — BLOOD GAS, ARTERIAL
BASE DEFICIT: 11.2 mmol/L
BICARBONATE: 15 mmol/L — ABNORMAL LOW (ref 22–26)
O2 FLOW RATE: 3.5 L/min
O2 SAT: 99 % — ABNORMAL HIGH (ref 92–97)
PCO2: 37 mmHg (ref 35.0–45.0)
PO2: 167 mmHg — ABNORMAL HIGH (ref 80–100)
pH: 7.24 — CL (ref 7.35–7.45)

## 2016-04-22 LAB — METABOLIC PANEL, COMPREHENSIVE
A-G Ratio: 1.1 (ref 1.1–2.2)
ALT (SGPT): 180 U/L — ABNORMAL HIGH (ref 12–78)
AST (SGOT): 313 U/L — ABNORMAL HIGH (ref 15–37)
Albumin: 1.6 g/dL — ABNORMAL LOW (ref 3.5–5.0)
Alk. phosphatase: 40 U/L — ABNORMAL LOW (ref 45–117)
Anion gap: 22 mmol/L — ABNORMAL HIGH (ref 5–15)
BUN/Creatinine ratio: 10 — ABNORMAL LOW (ref 12–20)
BUN: 11 MG/DL (ref 6–20)
Bilirubin, total: 2.2 MG/DL — ABNORMAL HIGH (ref 0.2–1.0)
CO2: 15 mmol/L — CL (ref 21–32)
Calcium: 8 MG/DL — ABNORMAL LOW (ref 8.5–10.1)
Chloride: 103 mmol/L (ref 97–108)
Creatinine: 1.07 MG/DL — ABNORMAL HIGH (ref 0.55–1.02)
GFR est AA: >60 mL/min/{1.73_m2} (ref >60)
GFR est non-AA: 51 mL/min/{1.73_m2} — ABNORMAL LOW (ref >60)
Globulin: 1.5 g/dL — ABNORMAL LOW (ref 2.0–4.0)
Glucose: 242 mg/dL — ABNORMAL HIGH (ref 65–100)
Potassium: 5.7 mmol/L — ABNORMAL HIGH (ref 3.5–5.1)
Protein, total: 3.1 g/dL — ABNORMAL LOW (ref 6.4–8.2)
Sodium: 140 mmol/L (ref 136–145)

## 2016-04-22 LAB — CK W/ REFLX CKMB: CK: 334 U/L — ABNORMAL HIGH (ref 26–192)

## 2016-04-22 LAB — FIBRINOGEN: Fibrinogen: <60 mg/dL — CL (ref 200–475)

## 2016-04-22 LAB — LACTIC ACID: Lactic acid: 22.4 MMOL/L — CR (ref 0.4–2.0)

## 2016-04-22 LAB — CK-MB,QUANT.
CK - MB: 7.3 NG/ML — ABNORMAL HIGH (ref <3.6)
CK-MB Index: 2.2 (ref 0–2.5)

## 2016-04-22 LAB — TROPONIN I: Troponin-I, Qt.: 0.82 ng/mL — ABNORMAL HIGH (ref <0.05)

## 2016-04-22 LAB — PROTHROMBIN TIME + INR
INR: 4.1 — ABNORMAL HIGH (ref 0.9–1.1)
Prothrombin time: 43 s — ABNORMAL HIGH (ref 9.0–11.1)

## 2016-04-22 LAB — PTT: aPTT: >130 s — CR (ref 22.1–32.5)

## 2016-04-22 LAB — GLUCOSE, POC: Glucose (POC): 259 mg/dL — ABNORMAL HIGH (ref 65–100)

## 2016-04-22 LAB — MAGNESIUM: Magnesium: 1.7 mg/dL (ref 1.6–2.4)

## 2016-04-22 MED ORDER — SODIUM CHLORIDE 0.9 % IV
INTRAVENOUS | Status: DC | PRN
Start: 2016-04-22 — End: 2016-04-22

## 2016-04-22 MED ORDER — SODIUM BICARBONATE 8.4 % (1 MEQ/ML) IV SYRG
8.4 % (1 mEq/mL) | Freq: Once | INTRAVENOUS | Status: AC
Start: 2016-04-22 — End: 2016-04-21

## 2016-04-22 MED ORDER — WATER FOR INJECTION, STERILE IV
18.4 mEq/mL (8.4 %) | INTRAVENOUS | Status: DC
Start: 2016-04-22 — End: 2016-04-22
  Administered 2016-04-22 (×3): via INTRAVENOUS

## 2016-04-22 MED ORDER — EPINEPHRINE 0.1 MG/ML SYRINGE
0.1 mg/mL | INTRAMUSCULAR | Status: AC | PRN
Start: 2016-04-22 — End: 2016-04-22
  Administered 2016-04-22 (×4): via INTRAVENOUS

## 2016-04-22 MED ORDER — ASPIRIN 325 MG TAB
325 mg | Freq: Every day | ORAL | Status: DC
Start: 2016-04-22 — End: 2016-04-22

## 2016-04-22 MED ORDER — SODIUM CHLORIDE 0.9 % IJ SYRG
Freq: Three times a day (TID) | INTRAMUSCULAR | Status: DC
Start: 2016-04-22 — End: 2016-04-22
  Administered 2016-04-22 (×2): via INTRAVENOUS

## 2016-04-22 MED ORDER — SODIUM BICARBONATE 8.4 % (1 MEQ/ML) IV SYRG
8.4 % (1 mEq/mL) | INTRAVENOUS | Status: AC | PRN
Start: 2016-04-22 — End: 2016-04-22
  Administered 2016-04-22 (×4): via INTRAVENOUS

## 2016-04-22 MED ORDER — HEPARIN (PORCINE) 5,000 UNIT/ML IJ SOLN
5000 unit/mL | Freq: Three times a day (TID) | INTRAMUSCULAR | Status: DC
Start: 2016-04-22 — End: 2016-04-21

## 2016-04-22 MED ORDER — CALCIUM GLUCONATE 100 MG/ML (10%) IV SOLN
100 mg/mL (10%) | Freq: Once | INTRAVENOUS | Status: AC
Start: 2016-04-22 — End: 2016-04-22
  Administered 2016-04-22: 04:00:00 via INTRAVENOUS

## 2016-04-22 MED ORDER — PROPOFOL 10 MG/ML IV EMUL
10 mg/mL | INTRAVENOUS | Status: DC
Start: 2016-04-22 — End: 2016-04-22

## 2016-04-22 MED ORDER — LACTATED RINGERS IV
INTRAVENOUS | Status: DC
Start: 2016-04-22 — End: 2016-04-21
  Administered 2016-04-22: 03:00:00 via INTRAVENOUS

## 2016-04-22 MED ORDER — NOREPINEPHRINE BITARTRATE 1 MG/ML IV
1 mg/mL | INTRAVENOUS | Status: DC
Start: 2016-04-22 — End: 2016-04-22
  Administered 2016-04-22 (×2): via INTRAVENOUS

## 2016-04-22 MED ORDER — SODIUM CHLORIDE 0.9 % IJ SYRG
INTRAMUSCULAR | Status: DC | PRN
Start: 2016-04-22 — End: 2016-04-22

## 2016-04-22 MED ORDER — PRAVASTATIN 20 MG TAB
20 mg | Freq: Every day | ORAL | Status: DC
Start: 2016-04-22 — End: 2016-04-22

## 2016-04-22 MED ORDER — DICLOFENAC 25 MG TAB, DELAYED RELEASE
25 mg | Freq: Two times a day (BID) | ORAL | Status: DC
Start: 2016-04-22 — End: 2016-04-21

## 2016-04-22 MED ORDER — EPINEPHRINE 1 MG/ML IJ SOLN
1 mg/mL | INTRAMUSCULAR | Status: DC
Start: 2016-04-22 — End: 2016-04-22
  Administered 2016-04-22: 09:00:00 via INTRAVENOUS

## 2016-04-22 MED ORDER — BUPROPION SR 150 MG TAB
150 mg | Freq: Every day | ORAL | Status: DC
Start: 2016-04-22 — End: 2016-04-22

## 2016-04-22 MED ORDER — SODIUM BICARBONATE 8.4 % (1 MEQ/ML) IV SYRG
8.4 % (1 mEq/mL) | INTRAVENOUS | Status: AC
Start: 2016-04-22 — End: 2016-04-21
  Administered 2016-04-22: 03:00:00 via INTRAVENOUS

## 2016-04-22 MED FILL — METOPROLOL TARTRATE 5 MG/5 ML IV SOLN: 5 mg/ mL | INTRAVENOUS | Qty: 5

## 2016-04-22 MED FILL — LACTATED RINGERS IV: INTRAVENOUS | Qty: 1000

## 2016-04-22 MED FILL — EPHEDRINE SULFATE 50 MG/ML IJ SOLN: 50 mg/mL | INTRAMUSCULAR | Qty: 50

## 2016-04-22 MED FILL — WATER FOR INJECTION, STERILE IV: INTRAVENOUS | Qty: 850

## 2016-04-22 MED FILL — CEFAZOLIN 2 GRAM/50 ML NS IVPB: INTRAVENOUS | Qty: 50

## 2016-04-22 MED FILL — DICLOFENAC 25 MG TAB, DELAYED RELEASE: 25 mg | ORAL | Qty: 3

## 2016-04-22 MED FILL — HEPARIN (PORCINE) 1,000 UNIT/ML IJ SOLN: 1000 unit/mL | INTRAMUSCULAR | Qty: 5000

## 2016-04-22 MED FILL — LIDOCAINE (PF) 20 MG/ML (2 %) IJ SOLN: 20 mg/mL (2 %) | INTRAMUSCULAR | Qty: 40

## 2016-04-22 MED FILL — PHENYLEPHRINE 10 MG/ML INJECTION: 10 mg/mL | INTRAMUSCULAR | Qty: 10

## 2016-04-22 MED FILL — DEXAMETHASONE SODIUM PHOSPHATE 4 MG/ML IJ SOLN: 4 mg/mL | INTRAMUSCULAR | Qty: 4

## 2016-04-22 MED FILL — ROCURONIUM 10 MG/ML IV: 10 mg/mL | INTRAVENOUS | Qty: 50

## 2016-04-22 MED FILL — SODIUM CHLORIDE 0.9 % IV: INTRAVENOUS | Qty: 250

## 2016-04-22 MED FILL — VAZCULEP 10 MG/ML INJECTION SOLUTION: 10 mg/mL | INTRAMUSCULAR | Qty: 18480

## 2016-04-22 MED FILL — CALCIUM GLUCONATE 100 MG/ML (10%) IV SOLN: 100 mg/mL (10%) | INTRAVENOUS | Qty: 20

## 2016-04-22 MED FILL — BD POSIFLUSH NORMAL SALINE 0.9 % INJECTION SYRINGE: INTRAMUSCULAR | Qty: 10

## 2016-04-22 MED FILL — ADRENALIN 1 MG/ML INJECTION SOLUTION: 1 mg/mL | INTRAMUSCULAR | Qty: 5

## 2016-04-22 MED FILL — PROPOFOL 10 MG/ML IV EMUL: 10 mg/mL | INTRAVENOUS | Qty: 100

## 2016-04-22 MED FILL — PROTAMINE 10 MG/ML IV: 10 mg/mL | INTRAVENOUS | Qty: 25

## 2016-04-22 MED FILL — NEOSTIGMINE METHYLSULFATE 1 MG/ML INJECTION: 1 mg/mL | INTRAMUSCULAR | Qty: 1

## 2016-04-22 MED FILL — GLYCOPYRROLATE 0.2 MG/ML IJ SOLN: 0.2 mg/mL | INTRAMUSCULAR | Qty: 0.2

## 2016-04-22 MED FILL — SODIUM BICARBONATE 8.4 % (1 MEQ/ML) IV SYRG: 8.4 % (1 mEq/mL) | INTRAVENOUS | Qty: 100

## 2016-04-22 MED FILL — NOREPINEPHRINE BITARTRATE 1 MG/ML IV: 1 mg/mL | INTRAVENOUS | Qty: 8

## 2016-04-22 MED FILL — QUELICIN 20 MG/ML INJECTION SOLUTION: 20 mg/mL | INTRAMUSCULAR | Qty: 60

## 2016-04-22 MED FILL — PROPOFOL 10 MG/ML IV EMUL: 10 mg/mL | INTRAVENOUS | Qty: 140

## 2016-04-22 NOTE — Progress Notes (Addendum)
Cardiac resuscitation note.    I was called to bedside for code blue. Patient unresponsive.  She had been intubated by anaesthesiology.  Adequate chest compressions underway.  Staff had given epi, bicarb and fluid, see flow sheet for details of timing.  At rhythm check, V fib was noted, and shock delivered.  At next check, monitor showed low voltage irregular tracing, which converted into a low voltage sinus.  Pulse returned.  Pupils were fixed and dilated.  Within minutes she lost pulse again.  Resuscitation resumed, with epi and bicarb, and again pulse returned.  Epi drip started.    I spent 25 minutes of time on the resuscitation, beyond and in addition to the time spent on the critical care consultation.

## 2016-04-22 NOTE — Other (Signed)
Entered chart for auditing purposes.

## 2016-04-22 NOTE — Progress Notes (Signed)
Pulse returned.

## 2016-04-22 NOTE — Other (Signed)
Chart accessed to obtain information needed by [employer] [place].

## 2016-04-22 NOTE — Progress Notes (Signed)
Patient unresponsive.  Pupils fixed.  No spontaneous respirations.  No heart sounds auscultated.    Death pronounced at 07:15 am

## 2016-04-22 NOTE — Progress Notes (Signed)
Spiritual Care Assessment/Progress Notes    Diane Burke [id number]  [id number]    [birth year]  65 y.o.  female    Patient Telephone Number: There is no home phone number on file.   Religious Affiliation: No religion   Language: English   Extended Emergency Contact Information  Primary Emergency Contact: Beazley,Diane  Address: [address]           [place], [place] [address] [place]  Home Phone: [phone]  Relation: Son   Patient Active Problem List    Diagnosis Date Noted   ??? Shock (HCC) 04/18/2016   ??? Anemia 05/10/2016   ??? Acute respiratory failure with hypoxia (HCC) 04/23/2016   ??? Leukocytosis 04/18/2016   ??? Acute respiratory acidosis 05/01/2016   ??? Chronic pain    ??? CAD (coronary artery disease)    ??? Hypertension    ??? Anxiety and depression    ??? Hyperlipidemia    ??? PVD (peripheral vascular disease) (HCC) 05-14-16   ??? Cirrhosis, alcoholic (HCC) 07/19/2011        Date: 04/23/2016       Level of Religious/Spiritual Activity:           Involved in faith tradition/spiritual practice             Not involved in faith tradition/spiritual practice           Spiritually oriented             Claims no spiritual orientation             seeking spiritual identity           Feels alienated from religious practice/tradition           Feels angry about religious practice/tradition           Spirituality/religious tradition may be  a Theatre stage manager for coping at this time.           Not able to assess due to medical condition    Services Provided Today:           crisis intervention             reading Scriptures           spiritual assessment             prayer           empathic listening/emotional support           rites and rituals (cite in comments)           life review              religious support           theological development            advocacy           ethical dialog              blessing           bereavement support             support to family            anticipatory grief support            help with AMD           spiritual guidance             meditation      Spiritual Care Needs  Emotional Support           Spiritual/Religious Care           Loss/Adjustment           Advocacy/Referral                /Ethics           No needs expressed at               this time           Other: (note in               comments)  Spiritual Care Plan           Follow up visits with               pt/family           Provide materials           Schedule sacraments           Contact Community               Clergy           Follow up as needed           Other: (note in               comments)     Comments: Chaplain responded to 2X Code Blue in ICU that lead to pt's death. Consulted with RN. Per RN, pt's son, Milta DeitersMichael Burke, [place]. Son arrived around 5:45am  Chaplain provided compassionate presence and support to the son. Son shared that he would not like to be in the ICU room with the pt at this time and so he waited in the waiting area. He shared a life review and how recently her health had been getting worse. He mentioned that he was her only son and apart for one of pt's sisters, he is the only other family alive. Mr Diane Burke also shared that they both had no spiritual support at the moment but he considered himself spiritual and that he appreciated chaplain support. He also shared that his mom was a strong person and always loved to do things on her own strength and her own will, thus, she would not want to be on machines and medications that kept her alive. Discussions were facilitated about end of life wishes for the pt between Mr. Diane Burke and staff. Pt discussed with chaplain about funeral decision and wishes as well.  Mr Diane Burke appreciates prayer. Advised of availability and bereavement center information via brochure. Chaplain assured him of continued emotional and spiritual support as needed/ desired.     Kennon Holteraniel D Kim   Chaplain, MDiv., M.S.  Spiritual Care Department  If needs rise please call [phone]

## 2016-04-22 NOTE — Progress Notes (Signed)
Compressions held. Rhythm check-- v-fib.  Patient shocked at 150j and CPR resumed.

## 2016-04-22 NOTE — Progress Notes (Signed)
Intubation Note    Name:  Diane Burke  Age:  65 y.o.  MRN:  [id number]  CSN:  [id number]  DOB:  March 04, 1951    Referring physician: Adah SalvageMarc T Warner, MD     Called to bedside secondary to  respiratory failure.      Patient pre-oxygenated with 100% oxygen.        DVL x 1    7.5 ETT taped and secured at 21 cm at the teeth.    + Bilateral BS, + Chest rise, + ETCO2    VSS.  CXR pending.    Care turned over to covering Attending MD.    Diane ScoreEthan Anmol Paschen, MD

## 2016-04-22 NOTE — Discharge Summary (Signed)
[place] [facility]   [address]   [place], [place] [id number]   DISCHARGE SUMMARY       Name:  Diane Burke, Diane Burke   MR#:  [id number]   DOB:  07/24/1950   Account #:  [id number]        Date of Adm:  05/01/2016       ADMISSION DIAGNOSIS: Peripheral vascular disease with rest pain.     DISCHARGE DIAGNOSIS: Same.     PROCEDURE: Aortobifemoral bypass done by me on the date of   admission.     DETAILS OF ADMISSION AND HOSPITAL COURSE: The patient is a   middle-aged female with aortoiliac occlusive disease who presented   for rectification. She had an aortic bifemoral bypass, which was   done without difficulty on the date of admission. In the Intensive Care   she developed cardiac arrest and eventually expired.         Adah SalvageMARC T. Trea Latner, MD      MW / DW   D:  04/26/2016   09:54   T:  04/26/2016   10:26   Job #:  [id number]

## 2016-04-22 NOTE — Progress Notes (Signed)
Compressions held.  Patient intubated by anesthesia.

## 2016-04-22 NOTE — Progress Notes (Signed)
[facility]acey [facility] reviewing case.

## 2016-04-22 NOTE — Progress Notes (Addendum)
0720: Bedside shift change report given to H.Deatra JamesNoe, Charity fundraiserN (Cabin crewoncoming nurse) by [employer]. Mayford KnifeWilliams, RN Physiological scientist(offgoing nurse). Report included the following information SBAR, Intake/Output, MAR, Accordion, Recent Results, Med Rec Status and Cardiac Rhythm Patient is expired.      [id number]: Notified [facility]. Spoke to Wm. Wrigley Jr. CompanyMark Sanders. All questions and information provided to [employer] staff.     [id number]: Post mortuum care provided to patient.     0908: [employer] [place]. Spoke to [place]. Will call back once patient is released. Awaiting [facility] call back within this hour.

## 2016-04-22 NOTE — Progress Notes (Signed)
CPR resumed.

## 2016-04-22 NOTE — Progress Notes (Signed)
Vascular Surgery  --events noted  --arrested and rhythm regained following extensive CPR effort  --blood transfused for low hg in pacu  --abdomen is not distended; strong femoral pulses; legs are not swollen  --discussed with Hospitalist team, family  --she has been made a DNR  --appreciate everyone's efforts

## 2016-04-22 NOTE — Progress Notes (Addendum)
Diane Burke. [facility]  [address] Diane Burke [place], [place]  [address]  [phone]    Hospitalist Consult Note      NAME:  Diane Burke   DOB:   04/07/1951   MRN:  [id number]     Requesting Physician No att. providers found   Reason for Consult:  Cardiac arrest     PCP:  Phys Other, MD     Date/Time:  04/23/2016 5:08 AM       Assessment and Plan:      PVD (peripheral vascular disease) / Chronic pain - Barely tolerated surgery.  Vascular will be attending to pain control, DVT prophylaxis and rehab decisions as usual.    Shock - Present after surgery and persistent.  Likely multifactorial, cardiogenic, hypovolemic, septic all possible.  Already on Abx.  Getting IVF and pressors.  ECHO planned for AM.    Acute respiratory failure with hypoxia and hypercapnia - Noted after surgery, and worse due to cardiac arrest.  Now intubated.  Check xray.  Repeat ABG.  Pulmonary already consulted.      Anemia / Thrombocytopenia - Severe post op.  Got transfusion of 2 U for Hb 6.6.  Monitor and transfuse to keep Hb >9 in light of shock and failure.  Check fibrinogen.    CAD (coronary artery disease) / Elevated troponin / Hypertension - High risk of cardiac event, and that may have caused code. Trend troponin.  ECHO.   Cards consult.  Hold statin but continue ASA    Cirrhosis, alcoholic - POA, unclear stage.  Adds severe risk to current recovery.  Unclear status of alcohol abstinence.    Anxiety and depression - Hold wellbutrin until better    Hyperlipidemia - Hold statin until better    Leukocytosis - Follow.      Addendum - Labs showed severe lactic acidosis, consistent with shock, and undetectable fibrinogen with prolonged APTT, consistent with DIC.   Patient had a further pulseless episode at 7AM, and in accord with wishes of family, no cardiac CPR was started.  She was pronounced dead.        Subjective:     CHIEF COMPLAINT: PAD, foot pain     HISTORY OF PRESENT ILLNESS:      Diane Burke is a 65 y.o.  Caucasian female who is admitted to the Vascular Service with foot pain, post aortobifemoral bypass.  We are asked to evaluate for cardiac arrest. She provides no hx.  No family is present. I have reviewed chart.  Patient with LLE PAD causing chronic pain.  Amputation suggested, but patient opted for AF bypass.  Post op she had shock requiring pressors.  She deteriorated during the evening, and had cardiac arrest at 4:45 AM.    Past Medical History:   Diagnosis Date   ??? Anxiety and depression    ??? CAD (coronary artery disease)    ??? Chronic pain     both legs   ??? Cirrhosis, alcoholic (HCC) 2013   ??? Hyperlipidemia    ??? Hypertension         Past Surgical History:   Procedure Laterality Date   ??? HX GYN Right 1990s    vulvar CA   ??? HX OTHER SURGICAL Bilateral 2010    femoral stents       Social History   Substance Use Topics   ??? Smoking status: Current Every Day Smoker     Packs/day: 1.00     Years: 53.00   ???  Smokeless tobacco: Never Used   ??? 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        History reviewed. No pertinent family history.     No Known Allergies     Prior to Admission medications    Medication Sig Start Date End Date Taking? Authorizing Provider   diclofenac EC (VOLTAREN) 75 mg EC tablet Take  by mouth two (2) times a day.   Yes Historical Provider   buPROPion SR [facility] SR) 150 mg SR tablet Take  by mouth daily.   Yes Historical Provider   aspirin (ASPIRIN) 325 mg tablet Take 325 mg by mouth daily.   Yes Historical Provider   traMADol (ULTRAM) 50 mg tablet Take 50 mg by mouth every six (6) hours as needed for Pain.   Yes Historical Provider   pravastatin (PRAVACHOL) 10 mg tablet Take  by mouth daily.    Historical Provider       No current facility-administered medications for this encounter.     Current Outpatient Prescriptions:   ???  diclofenac EC (VOLTAREN) 75 mg EC tablet, Take  by mouth two (2) times a day., Disp: , Rfl:    ???  buPROPion SR (WELLBUTRIN SR) 150 mg SR tablet, Take  by mouth daily., Disp: , Rfl:   ???  aspirin (ASPIRIN) 325 mg tablet, Take 325 mg by mouth daily., Disp: , Rfl:   ???  traMADol (ULTRAM) 50 mg tablet, Take 50 mg by mouth every six (6) hours as needed for Pain., Disp: , Rfl:   ???  pravastatin (PRAVACHOL) 10 mg tablet, Take  by mouth daily., Disp: , Rfl:     Review of Systems:  Cannot obtain  (bold if positive, if negative)    Gen:  Eyes:  ENT:  CVS:  Pulm:  GI:    GU:    MS:  Skin:  Psych:  Endo:    Hem:  Renal:    Neuro:            Objective:      VITALS:    Vital signs reviewed; most recent are:    Visit Vitals   ??? BP (!) 76/48   ??? Pulse (!) 123   ??? Temp 96 ??F (35.6 ??C)   ??? Resp 21   ??? Ht 5\' 1"  (1.549 m)   ??? Wt 44.5 kg (98 lb)   ??? SpO2 90%   ??? BMI 18.52 kg/m2     SpO2 Readings from Last 6 Encounters:   05/07/2016 90%   04/15/16 99%    O2 Flow Rate (L/min): 2 l/min       Intake/Output Summary (Last 24 hours) at 04/26/2016 1613  Last data filed at 04/25/2016 0143   Gross per 24 hour   Intake              900 ml   Output              180 ml   Net              720 ml      All Micro Results     None            Exam:     Physical Exam:    Gen:  Thin, fail cachectic, obtunded  HEENT:  Pink conjunctivae, pupils fixed and dilated, hearing noted intact to voice, ET in place  Neck:  Supple, without masses, thyroid non-tender  Resp:  No accessory muscle use, bilateral breath sounds without wheezes rales or rhonchi  Card:  No murmurs, distant S1, S2 without thrills, bruits or peripheral edema  Abd:  Soft, non-tender, non-distended, reduced bowel sounds are present, no mass  Lymph:  No cervical or inguinal adenopathy  Musc:  No cyanosis or clubbing  Skin:  LLE red and dusky, skin turgor is reduced  Neuro:  Cranial nerves show fixed and dilated pupils, complete motor weakness, somnolent  Psych:  Obtunded       Labs:    Recent Labs      04/26/2016   0424   WBC  10.3   HGB  7.7*   HCT  23.1*   PLT  94*     Recent Labs      05/16/2016    0424   NA  140   K  5.7*   CL  103   CO2  15*   GLU  242*   BUN  11   CREA  1.07*   CA  8.0*   MG  1.7   ALB  1.6*   TBILI  2.2*   SGOT  313*   ALT  180*     Lab Results   Component Value Date/Time    Glucose (POC) 259 05/14/2016 04:30 AM     Recent Labs      2016-04-06   2240   PH  7.24*   PCO2  37   PO2  167*   HCO3  15*     Recent Labs      04/20/2016   0530   INR  4.1*       I have reviewed previous records,    Telemetry reviewed:   normal sinus rhythm    Risk of deterioration: high      Total time spent with patient: 45 minutes intensive care, with cardiac resuscitation                  Care Plan discussed with: Family and Nursing Staff    Discussed with son.  He states he and patient would not want further resuscitation efforts if pulseless again.    Discussed:  Care Plan       ___________________________________________________    Attending Physician: Salvadore DomMichael E Vincente Asbridge, MD

## 2016-04-22 NOTE — Progress Notes (Signed)
Patient lost pulse at this time.  CPR started.

## 2016-04-22 NOTE — Progress Notes (Signed)
CPR ongoing.  Pupils fixed and dilated per Dr. Eulah PontMurphy.

## 2016-04-22 NOTE — Progress Notes (Signed)
Compressions held.  Patient has pulse.  Rhythm- sinus tach.

## 2016-04-22 NOTE — Op Note (Signed)
[place] [facility]   [address]   [place], [place] [id number]   OP NOTE       Name:  Diane Burke, Diane Burke   MR#:  [id number]   DOB:  [birth year]   Account #:  [id number]    Surgery Date:  Aug 20, 2015   Date of Adm:  Aug 20, 2015       PREOPERATIVE DIAGNOSIS: Occluded abdominal aorta.    POSTOPERATIVE DIAGNOSIS: Occluded abdominal aorta.    PROCEDURES PERFORMED: Aortobifemoral bypass.    SURGEON: Vernia BuffMarc T. Sheliah HatchWarner, MD    ANESTHESIA: General anesthesia.    ESTIMATED BLOOD LOSS: 200 mL.    COMPLICATIONS: None.    SPECIMENS REMOVED: None.    INDICATIONS FOR PROCEDURE: The patient is a 65 year old   female with severe occlusive disease of the abdominal aorta. She has   been operated previously in an outside facility, and decision was made   to perform aortobifemoral bypass.    DESCRIPTION OF PROCEDURE: The patient was taken to the   operating room. Once a suitable level of anesthesia was introduced,   she was prepped and draped in typical sterile fashion. Incisions in   bilateral groins were opened, and dissection carried out down to the   common femoral artery on both the left and right sides. These were   dissected free of their soft tissue attachments. Next, a generous   midline abdominal incision was made. The colon was reflected   superiorly, and dissection down in the retroperitoneum undertaken.   The Bookwalter retractor was placed, and the patient was systemically   heparinized. The abdominal aorta was then controlled just inferior to   the renal arteries, and a section of the abdominal aorta removed. The   distal aorta was oversewn with interrupted Prolene suture. Next, an   end-to-side, end-to-end anastomosis between a 12 x 6 bifurcated   Dacron graft, secured in place with 3-0 Prolene. Flow was then   restored. Hemostasis was controlled. The graft was then tunneled   anatomically inferior to the ureters on both the left and right sides. The    graft was then sewed end-to-side to the distal common femoral artery   on both the left and right sides using 4-0 Prolene suture. Flow was   restored through the graft. There were excellent signals throughout.   Hemostasis was controlled, and the wounds were closed in multiple   layers. She tolerated the procedure well. There were no obvious   complications. She was transferred to the recovery in good condition.        Adah SalvageMARC T. [name], MD      MW [facility]   D:  05/07/2016   09:39   T:  04/21/2016   10:20   Job #:  [id number]

## 2016-05-18 DEATH — deceased

## 2016-05-23 LAB — EKG, 12 LEAD, INITIAL
Atrial Rate: 106 {beats}/min
Calculated P Axis: 60 degrees
Calculated R Axis: 55 degrees
Calculated T Axis: 83 degrees
P-R Interval: 144 ms
Q-T Interval: 386 ms
QRS Duration: 90 ms
QTC Calculation (Bezet): 512 ms
Ventricular Rate: 106 {beats}/min
